# Patient Record
Sex: Female | Born: 1980 | Race: White | Hispanic: No | Marital: Married | Smoking: Never smoker
Health system: Southern US, Community
[De-identification: ages and names within clinical notes are randomized; demographics above are authoritative.]

## PROBLEM LIST (undated history)

## (undated) DIAGNOSIS — M5126 Other intervertebral disc displacement, lumbar region: Secondary | ICD-10-CM

## (undated) DIAGNOSIS — M51369 Other intervertebral disc degeneration, lumbar region without mention of lumbar back pain or lower extremity pain: Secondary | ICD-10-CM

## (undated) DIAGNOSIS — M5136 Other intervertebral disc degeneration, lumbar region: Secondary | ICD-10-CM

## (undated) DIAGNOSIS — F419 Anxiety disorder, unspecified: Secondary | ICD-10-CM

## (undated) HISTORY — DX: Anxiety disorder, unspecified: F41.9

## (undated) HISTORY — DX: Other intervertebral disc displacement, lumbar region: M51.26

## (undated) HISTORY — DX: Other intervertebral disc degeneration, lumbar region without mention of lumbar back pain or lower extremity pain: M51.369

## (undated) HISTORY — DX: Other intervertebral disc degeneration, lumbar region: M51.36

---

## 2007-06-06 ENCOUNTER — Ambulatory Visit: Payer: Self-pay | Admitting: Emergency Medicine

## 2007-08-14 ENCOUNTER — Ambulatory Visit: Payer: Self-pay | Admitting: Emergency Medicine

## 2007-09-27 ENCOUNTER — Ambulatory Visit: Payer: Self-pay | Admitting: Otolaryngology

## 2008-05-02 ENCOUNTER — Emergency Department: Payer: Self-pay | Admitting: Emergency Medicine

## 2008-07-05 ENCOUNTER — Ambulatory Visit: Payer: Self-pay | Admitting: Internal Medicine

## 2009-06-28 ENCOUNTER — Ambulatory Visit: Payer: Self-pay | Admitting: Internal Medicine

## 2009-11-07 ENCOUNTER — Observation Stay: Payer: Self-pay | Admitting: Obstetrics & Gynecology

## 2009-11-09 ENCOUNTER — Inpatient Hospital Stay: Payer: Self-pay

## 2010-09-29 ENCOUNTER — Ambulatory Visit: Payer: Self-pay | Admitting: Internal Medicine

## 2011-01-03 ENCOUNTER — Ambulatory Visit: Payer: Self-pay | Admitting: Family Medicine

## 2013-05-16 ENCOUNTER — Ambulatory Visit: Payer: Self-pay | Admitting: Family Medicine

## 2013-11-29 ENCOUNTER — Emergency Department: Payer: Self-pay | Admitting: Emergency Medicine

## 2013-11-29 LAB — URINALYSIS, COMPLETE
BILIRUBIN, UR: NEGATIVE
BLOOD: NEGATIVE
Bacteria: NONE SEEN
Glucose,UR: NEGATIVE mg/dL (ref 0–75)
Ketone: NEGATIVE
Leukocyte Esterase: NEGATIVE
NITRITE: NEGATIVE
Ph: 9 (ref 4.5–8.0)
RBC,UR: 1 /HPF (ref 0–5)
SPECIFIC GRAVITY: 1.02 (ref 1.003–1.030)
WBC UR: 2 /HPF (ref 0–5)

## 2013-11-29 LAB — COMPREHENSIVE METABOLIC PANEL
ALK PHOS: 147 U/L — AB
Albumin: 4.1 g/dL (ref 3.4–5.0)
Anion Gap: 7 (ref 7–16)
BUN: 11 mg/dL (ref 7–18)
Bilirubin,Total: 1 mg/dL (ref 0.2–1.0)
CALCIUM: 8.9 mg/dL (ref 8.5–10.1)
CO2: 25 mmol/L (ref 21–32)
CREATININE: 0.83 mg/dL (ref 0.60–1.30)
Chloride: 106 mmol/L (ref 98–107)
EGFR (African American): >60
EGFR (Non-African Amer.): >60
Glucose: 134 mg/dL — ABNORMAL HIGH (ref 65–99)
Osmolality: 277 (ref 275–301)
Potassium: 3.9 mmol/L (ref 3.5–5.1)
SGOT(AST): 56 U/L — ABNORMAL HIGH (ref 15–37)
SGPT (ALT): 99 U/L — ABNORMAL HIGH (ref 12–78)
Sodium: 138 mmol/L (ref 136–145)
Total Protein: 8.1 g/dL (ref 6.4–8.2)

## 2013-11-29 LAB — CBC WITH DIFFERENTIAL/PLATELET
Basophil #: 0 10*3/uL (ref 0.0–0.1)
Basophil %: 0.2 %
EOS PCT: 0.2 %
Eosinophil #: 0 10*3/uL (ref 0.0–0.7)
HCT: 43.7 % (ref 35.0–47.0)
HGB: 14.7 g/dL (ref 12.0–16.0)
LYMPHS ABS: 0.2 10*3/uL — AB (ref 1.0–3.6)
Lymphocyte %: 1.8 %
MCH: 28.2 pg (ref 26.0–34.0)
MCHC: 33.7 g/dL (ref 32.0–36.0)
MCV: 84 fL (ref 80–100)
Monocyte #: 0.5 x10 3/mm (ref 0.2–0.9)
Monocyte %: 4.1 %
NEUTROS PCT: 93.7 %
Neutrophil #: 10.6 10*3/uL — ABNORMAL HIGH (ref 1.4–6.5)
Platelet: 286 10*3/uL (ref 150–440)
RBC: 5.23 10*6/uL — AB (ref 3.80–5.20)
RDW: 13.5 % (ref 11.5–14.5)
WBC: 11.3 10*3/uL — ABNORMAL HIGH (ref 3.6–11.0)

## 2013-11-29 LAB — LIPASE, BLOOD: LIPASE: 60 U/L — AB (ref 73–393)

## 2014-04-18 ENCOUNTER — Ambulatory Visit: Payer: Self-pay

## 2014-04-18 LAB — RAPID STREP-A WITH REFLX: Micro Text Report: NEGATIVE

## 2014-04-21 LAB — BETA STREP CULTURE(ARMC)

## 2015-01-30 ENCOUNTER — Ambulatory Visit: Payer: Self-pay | Admitting: Family Medicine

## 2015-03-18 LAB — OB RESULTS CONSOLE HIV ANTIBODY (ROUTINE TESTING): HIV: NONREACTIVE

## 2015-03-18 LAB — OB RESULTS CONSOLE RPR: RPR: NONREACTIVE

## 2015-05-14 LAB — OB RESULTS CONSOLE GBS: GBS: NEGATIVE

## 2015-05-14 LAB — OB RESULTS CONSOLE GC/CHLAMYDIA
Chlamydia: NEGATIVE
GC PROBE AMP, GENITAL: NEGATIVE

## 2015-05-30 ENCOUNTER — Encounter
Admission: EM | Disposition: A | Discharge status: Home / Self Care | Payer: Self-pay | Source: Home / Self Care | Attending: Obstetrics and Gynecology

## 2015-05-30 ENCOUNTER — Inpatient Hospital Stay: Payer: 59 | Admitting: Anesthesiology

## 2015-05-30 ENCOUNTER — Inpatient Hospital Stay
Admission: EM | Admit: 2015-05-30 | Discharge: 2015-06-01 | DRG: 765 | Disposition: A | Discharge status: Home / Self Care | Payer: 59 | Attending: Obstetrics and Gynecology | Admitting: Obstetrics and Gynecology

## 2015-05-30 ENCOUNTER — Encounter: Payer: Self-pay | Admitting: Anesthesiology

## 2015-05-30 DIAGNOSIS — D62 Acute posthemorrhagic anemia: Secondary | ICD-10-CM | POA: Diagnosis not present

## 2015-05-30 DIAGNOSIS — Z880 Allergy status to penicillin: Secondary | ICD-10-CM | POA: Diagnosis not present

## 2015-05-30 DIAGNOSIS — Z3A38 38 weeks gestation of pregnancy: Secondary | ICD-10-CM | POA: Diagnosis present

## 2015-05-30 DIAGNOSIS — K660 Peritoneal adhesions (postprocedural) (postinfection): Secondary | ICD-10-CM | POA: Diagnosis present

## 2015-05-30 DIAGNOSIS — O3421 Maternal care for scar from previous cesarean delivery: Secondary | ICD-10-CM | POA: Diagnosis present

## 2015-05-30 LAB — CBC
HCT: 35.9 % (ref 35.0–47.0)
Hemoglobin: 11.7 g/dL — ABNORMAL LOW (ref 12.0–16.0)
MCH: 26.7 pg (ref 26.0–34.0)
MCHC: 32.6 g/dL (ref 32.0–36.0)
MCV: 81.7 fL (ref 80.0–100.0)
PLATELETS: 153 10*3/uL (ref 150–440)
RBC: 4.39 MIL/uL (ref 3.80–5.20)
RDW: 15.4 % — ABNORMAL HIGH (ref 11.5–14.5)
WBC: 9.5 10*3/uL (ref 3.6–11.0)

## 2015-05-30 LAB — TYPE AND SCREEN
ABO/RH(D): O POS
Antibody Screen: NEGATIVE

## 2015-05-30 LAB — ABO/RH: ABO/RH(D): O POS

## 2015-05-30 SURGERY — Surgical Case
Anesthesia: Spinal | Wound class: Clean Contaminated

## 2015-05-30 MED ORDER — BUPIVACAINE IN DEXTROSE 0.75-8.25 % IT SOLN
INTRATHECAL | Status: DC | PRN
  Administered 2015-05-30: 1.6 mL via INTRATHECAL

## 2015-05-30 MED ORDER — SIMETHICONE 80 MG PO CHEW: 80.0000 mg | CHEWABLE_TABLET | ORAL | Status: DC | PRN

## 2015-05-30 MED ORDER — PROMETHAZINE HCL 25 MG/ML IJ SOLN
12.5000 mg | Freq: Four times a day (QID) | INTRAMUSCULAR | Status: DC | PRN
  Administered 2015-05-30: 12.5 mg via INTRAMUSCULAR
  Filled 2015-05-30: qty 1

## 2015-05-30 MED ORDER — NALBUPHINE HCL 10 MG/ML IJ SOLN
5.0000 mg | Freq: Once | INTRAMUSCULAR | Status: AC | PRN
  Filled 2015-05-30: qty 0.5

## 2015-05-30 MED ORDER — LACTATED RINGERS IV SOLN: 500.0000 mL | INTRAVENOUS | Status: DC | PRN

## 2015-05-30 MED ORDER — SCOPOLAMINE 1 MG/3DAYS TD PT72
1.0000 | MEDICATED_PATCH | Freq: Once | TRANSDERMAL | Status: DC
  Filled 2015-05-30: qty 1

## 2015-05-30 MED ORDER — LACTATED RINGERS IV SOLN
INTRAVENOUS | Status: DC
  Administered 2015-05-30 (×2): via INTRAVENOUS

## 2015-05-30 MED ORDER — BUPIVACAINE HCL (PF) 0.5 % IJ SOLN
INTRAMUSCULAR | Status: AC
  Filled 2015-05-30: qty 30

## 2015-05-30 MED ORDER — NALOXONE HCL 1 MG/ML IJ SOLN: 1.0000 ug/kg/h | INTRAVENOUS | Status: DC | PRN

## 2015-05-30 MED ORDER — CITRIC ACID-SODIUM CITRATE 334-500 MG/5ML PO SOLN
ORAL | Status: AC
  Administered 2015-05-30: 30 mL
  Filled 2015-05-30: qty 15

## 2015-05-30 MED ORDER — FENTANYL CITRATE (PF) 100 MCG/2ML IJ SOLN: 25.0000 ug | INTRAMUSCULAR | Status: DC | PRN

## 2015-05-30 MED ORDER — OXYCODONE-ACETAMINOPHEN 5-325 MG PO TABS: 1.0000 | ORAL_TABLET | ORAL | Status: DC | PRN

## 2015-05-30 MED ORDER — ONDANSETRON HCL 4 MG/2ML IJ SOLN
4.0000 mg | Freq: Three times a day (TID) | INTRAMUSCULAR | Status: DC | PRN
  Administered 2015-05-30: 4 mg via INTRAVENOUS
  Filled 2015-05-30: qty 2

## 2015-05-30 MED ORDER — KETOROLAC TROMETHAMINE 30 MG/ML IJ SOLN: 30.0000 mg | Freq: Four times a day (QID) | INTRAMUSCULAR | Status: AC | PRN

## 2015-05-30 MED ORDER — DIPHENHYDRAMINE HCL 50 MG/ML IJ SOLN: 12.5000 mg | INTRAMUSCULAR | Status: DC | PRN

## 2015-05-30 MED ORDER — EPHEDRINE SULFATE 50 MG/ML IJ SOLN
INTRAMUSCULAR | Status: DC | PRN
  Administered 2015-05-30: 15 mg via INTRAVENOUS

## 2015-05-30 MED ORDER — LACTATED RINGERS IV SOLN: INTRAVENOUS | Status: DC

## 2015-05-30 MED ORDER — CLINDAMYCIN PHOSPHATE 900 MG/50ML IV SOLN: 900.0000 mg | INTRAVENOUS | Status: DC

## 2015-05-30 MED ORDER — NALOXONE HCL 0.4 MG/ML IJ SOLN: 0.4000 mg | INTRAMUSCULAR | Status: DC | PRN

## 2015-05-30 MED ORDER — PHENYLEPHRINE HCL 10 MG/ML IJ SOLN
INTRAMUSCULAR | Status: DC | PRN
  Administered 2015-05-30: 200 ug via INTRAVENOUS

## 2015-05-30 MED ORDER — SODIUM CHLORIDE 0.9 % IJ SOLN: 3.0000 mL | INTRAMUSCULAR | Status: DC | PRN

## 2015-05-30 MED ORDER — OXYTOCIN BOLUS FROM INFUSION: 500.0000 mL | INTRAVENOUS | Status: DC

## 2015-05-30 MED ORDER — MORPHINE SULFATE (PF) 0.5 MG/ML IJ SOLN: INTRAMUSCULAR | Status: DC | PRN

## 2015-05-30 MED ORDER — SENNOSIDES-DOCUSATE SODIUM 8.6-50 MG PO TABS
2.0000 | ORAL_TABLET | ORAL | Status: DC
  Administered 2015-05-30: 2 via ORAL
  Filled 2015-05-30: qty 2

## 2015-05-30 MED ORDER — TERBUTALINE SULFATE 1 MG/ML IJ SOLN
INTRAMUSCULAR | Status: AC
Start: 2015-05-30 — End: 2015-05-31
  Filled 2015-05-30: qty 1

## 2015-05-30 MED ORDER — WITCH HAZEL-GLYCERIN EX PADS: 1.0000 "application " | MEDICATED_PAD | CUTANEOUS | Status: DC | PRN

## 2015-05-30 MED ORDER — OXYTOCIN 40 UNITS IN LACTATED RINGERS INFUSION - SIMPLE MED
INTRAVENOUS | Status: AC
  Filled 2015-05-30: qty 1000

## 2015-05-30 MED ORDER — BUPIVACAINE HCL (PF) 0.5 % IJ SOLN: 10.0000 mL | Freq: Once | INTRAMUSCULAR | Status: DC

## 2015-05-30 MED ORDER — BUPIVACAINE 0.25 % ON-Q PUMP DUAL CATH 400 ML
INJECTION | Status: AC
  Filled 2015-05-30: qty 400

## 2015-05-30 MED ORDER — BUPIVACAINE 0.25 % ON-Q PUMP DUAL CATH 400 ML: 400.0000 mL | INJECTION | Status: DC

## 2015-05-30 MED ORDER — OXYTOCIN 40 UNITS IN LACTATED RINGERS INFUSION - SIMPLE MED
62.5000 mL/h | INTRAVENOUS | Status: AC
  Administered 2015-05-31: 62.5 mL/h via INTRAVENOUS
  Filled 2015-05-30: qty 1000

## 2015-05-30 MED ORDER — IBUPROFEN 600 MG PO TABS
600.0000 mg | ORAL_TABLET | Freq: Four times a day (QID) | ORAL | Status: DC
  Administered 2015-05-30 – 2015-06-01 (×7): 600 mg via ORAL
  Filled 2015-05-30 (×7): qty 1

## 2015-05-30 MED ORDER — BUPIVACAINE HCL (PF) 0.5 % IJ SOLN
INTRAMUSCULAR | Status: DC | PRN
  Administered 2015-05-30: 10 mL

## 2015-05-30 MED ORDER — MEPERIDINE HCL 25 MG/ML IJ SOLN: 6.2500 mg | INTRAMUSCULAR | Status: DC | PRN

## 2015-05-30 MED ORDER — DIPHENHYDRAMINE HCL 25 MG PO CAPS: 25.0000 mg | ORAL_CAPSULE | ORAL | Status: DC | PRN

## 2015-05-30 MED ORDER — ONDANSETRON HCL 4 MG/2ML IJ SOLN: 4.0000 mg | Freq: Once | INTRAMUSCULAR | Status: AC | PRN

## 2015-05-30 MED ORDER — PRENATAL MULTIVITAMIN CH
1.0000 | ORAL_TABLET | Freq: Every day | ORAL | Status: DC
  Administered 2015-05-31 – 2015-06-01 (×2): 1 via ORAL
  Filled 2015-05-30 (×2): qty 1

## 2015-05-30 MED ORDER — MENTHOL 3 MG MT LOZG
1.0000 | LOZENGE | OROMUCOSAL | Status: DC | PRN
  Filled 2015-05-30: qty 9

## 2015-05-30 MED ORDER — SIMETHICONE 80 MG PO CHEW
80.0000 mg | CHEWABLE_TABLET | Freq: Three times a day (TID) | ORAL | Status: DC
  Administered 2015-05-31 – 2015-06-01 (×5): 80 mg via ORAL
  Filled 2015-05-30 (×5): qty 1

## 2015-05-30 MED ORDER — DIPHENHYDRAMINE HCL 25 MG PO CAPS: 25.0000 mg | ORAL_CAPSULE | Freq: Four times a day (QID) | ORAL | Status: DC | PRN

## 2015-05-30 MED ORDER — LANOLIN HYDROUS EX OINT: 1.0000 "application " | TOPICAL_OINTMENT | CUTANEOUS | Status: DC | PRN

## 2015-05-30 MED ORDER — OXYTOCIN 40 UNITS IN LACTATED RINGERS INFUSION - SIMPLE MED
62.5000 mL/h | INTRAVENOUS | Status: DC
  Administered 2015-05-30: 1000 mL via INTRAVENOUS

## 2015-05-30 MED ORDER — SIMETHICONE 80 MG PO CHEW
80.0000 mg | CHEWABLE_TABLET | ORAL | Status: DC
  Administered 2015-05-30 – 2015-05-31 (×2): 80 mg via ORAL
  Filled 2015-05-30 (×2): qty 1

## 2015-05-30 MED ORDER — DIBUCAINE 1 % RE OINT: 1.0000 "application " | TOPICAL_OINTMENT | RECTAL | Status: DC | PRN

## 2015-05-30 MED ORDER — MORPHINE SULFATE (PF) 0.5 MG/ML IJ SOLN
INTRAMUSCULAR | Status: DC | PRN
  Administered 2015-05-30: .2 mg via EPIDURAL

## 2015-05-30 MED ORDER — GENTAMICIN SULFATE 40 MG/ML IJ SOLN
5.0000 mg/kg | INTRAVENOUS | Status: AC
  Administered 2015-05-30: 500 mg via INTRAVENOUS
  Filled 2015-05-30: qty 12.5

## 2015-05-30 MED ORDER — NALBUPHINE HCL 10 MG/ML IJ SOLN
5.0000 mg | INTRAMUSCULAR | Status: DC | PRN
  Filled 2015-05-30: qty 0.5

## 2015-05-30 MED ORDER — OXYCODONE-ACETAMINOPHEN 5-325 MG PO TABS
2.0000 | ORAL_TABLET | ORAL | Status: DC | PRN
Start: 2015-05-30 — End: 2015-05-31

## 2015-05-30 MED ORDER — FENTANYL CITRATE (PF) 100 MCG/2ML IJ SOLN
INTRAMUSCULAR | Status: DC | PRN
  Administered 2015-05-30 (×5): 50 ug via INTRAVENOUS

## 2015-05-30 MED ORDER — ONDANSETRON HCL 4 MG/2ML IJ SOLN
4.0000 mg | Freq: Four times a day (QID) | INTRAMUSCULAR | Status: DC | PRN
  Administered 2015-05-30: 4 mg via INTRAVENOUS

## 2015-05-30 SURGICAL SUPPLY — 28 items
BAG COUNTER SPONGE EZ (MISCELLANEOUS) ×2 IMPLANT
CANISTER SUCT 3000ML (MISCELLANEOUS) ×3 IMPLANT
CATH KIT ON-Q SILVERSOAK 5IN (CATHETERS) ×6 IMPLANT
CHLORAPREP W/TINT 26ML (MISCELLANEOUS) ×6 IMPLANT
CLOSURE WOUND 1/2 X4 (GAUZE/BANDAGES/DRESSINGS) ×1
COUNTER SPONGE BAG EZ (MISCELLANEOUS) ×1
DRSG TELFA 3X8 NADH (GAUZE/BANDAGES/DRESSINGS) ×3 IMPLANT
ELECT CAUTERY BLADE 6.4 (BLADE) ×3 IMPLANT
GAUZE SPONGE 4X4 12PLY STRL (GAUZE/BANDAGES/DRESSINGS) ×3 IMPLANT
GLOVE BIO SURGEON STRL SZ7 (GLOVE) ×3 IMPLANT
GLOVE INDICATOR 7.5 STRL GRN (GLOVE) ×3 IMPLANT
GOWN STRL REUS W/ TWL LRG LVL3 (GOWN DISPOSABLE) ×3 IMPLANT
GOWN STRL REUS W/TWL LRG LVL3 (GOWN DISPOSABLE) ×6
LIQUID BAND (GAUZE/BANDAGES/DRESSINGS) ×3 IMPLANT
NS IRRIG 1000ML POUR BTL (IV SOLUTION) ×3 IMPLANT
PACK C SECTION AR (MISCELLANEOUS) ×3 IMPLANT
PAD GROUND ADULT SPLIT (MISCELLANEOUS) ×3 IMPLANT
PAD OB MATERNITY 4.3X12.25 (PERSONAL CARE ITEMS) ×3 IMPLANT
PAD PREP 24X41 OB/GYN DISP (PERSONAL CARE ITEMS) ×3 IMPLANT
STRIP CLOSURE SKIN 1/2X4 (GAUZE/BANDAGES/DRESSINGS) ×2 IMPLANT
SUT MNCRL AB 4-0 PS2 18 (SUTURE) ×3 IMPLANT
SUT PDS AB 1 TP1 96 (SUTURE) ×6 IMPLANT
SUT PLAIN 2 0 XLH (SUTURE) ×3 IMPLANT
SUT PLAIN GUT 2-0 30 C14 SG823 (SUTURE) ×3
SUT VIC AB 0 CTX 36 (SUTURE) ×4
SUT VIC AB 0 CTX36XBRD ANBCTRL (SUTURE) ×2 IMPLANT
SUT VIC AB 2-0 CT1 36 (SUTURE) ×3 IMPLANT
SUTURE PLN GUT2-0 30 C14 SG823 (SUTURE) ×1 IMPLANT

## 2015-05-30 NOTE — Discharge Summary (Signed)
Obstetric Discharge Summary Reason for Admission: onset of labor and cesarean section Prenatal Procedures: NST Intrapartum Procedures: primary low transverse c-section Postpartum Procedures: none Complications-Operative and Postpartum: none, mild blood loss anemia HEMOGLOBIN  Date Value Ref Range Status  05/30/2015 11.7* 12.0 - 16.0 g/dL Final   HGB  Date Value Ref Range Status  11/29/2013 14.7 12.0-16.0 g/dL Final   HCT  Date Value Ref Range Status  05/30/2015 35.9 35.0 - 47.0 % Final  11/29/2013 43.7 35.0-47.0 % Final   Hct: 35.9 pre-op, to 29 post-op Physical Exam:  General: alert Lochia: appropriate Uterine Fundus: firm Incision: healing well, no significant drainage DVT Evaluation: No evidence of DVT seen on physical exam.  Discharge Diagnoses: Term Pregnancy-delivered  Discharge Information: Date: 05/30/2015 Activity: pelvic rest Diet: routine Medications: PNV, Ibuprofen and Vicodin Condition: stable Instructions:  Discharge instructions:   Call office if you have any of the following: headache, visual changes, fever >100 F, chills, breast concerns, excessive vaginal bleeding, incision drainage or problems, leg pain or redness, depression or any other concerns.   Activity: Do not lift > 10 lbs for 6 weeks.  No intercourse or tampons for 6 weeks.  No driving for 1-2 weeks.    Discharge to: home   Newborn Data: Live born female  Birth Weight: 8 lb 15.2 oz (4060 g) APGAR: 9, 9  Home with mother.   Vella Kohleramara K Brothers, CNM

## 2015-05-30 NOTE — Transfer of Care (Signed)
Immediate Anesthesia Transfer of Care Note  Patient: Susan Stevenson  Procedure(s) Performed: Procedure(s): CESAREAN SECTION  (N/A)  Patient Location: PACU  Anesthesia Type:Spinal  Level of Consciousness: awake, alert  and oriented  Airway & Oxygen Therapy: Patient Spontanous Breathing  Post-op Assessment: Report given to RN and Post -op Vital signs reviewed and stable  Post vital signs: Reviewed and stable  Last Vitals:  Filed Vitals:   05/30/15 1733  BP: 115/68  Pulse: 109  Temp: 36.7 C  Resp: 12    Complications: No apparent anesthesia complications

## 2015-05-30 NOTE — Op Note (Signed)
Preoperative Diagnosis: 1) 34 y.o. G1P0 at 3784w2d 2) History of prior C-section 3) Desires repeat C-section 4) Desires permanent surgical sterilization  Postoperative Diagnosis: 1) 34 y.o. G1P0 at 5684w2d 2) History of prior C-section 3) Desires repeat C-section 4) Dense adhesive disease  Operation Performed: Repeat low transverse C-section via pfannenstiel skin incision  Indiciation: Prior C-section  Anesthesia: Spinal  Primary Surgeon: Vena AustriaAndreas Taylinn Brabant, MD   Assistant:** Milinda Hirschfeldourtney Subhuddi, CNM  Preoperative Antibiotics: Gentamycin & Clindamycin  Estimated Blood Loss: \85500mL  IV Fluids: 1100mL  Urine Output:: 250mL  Drains or Tubes: Foley to gravity drainage, ON-Q catheter system  Implants: none  Specimens Removed: none  Complications: none  Intraoperative Findings:  Normal tubes ovaries and uterus.  Delivery resulted in the birth of a liveborn female infant, APGAR (1 MIN): 9   APGAR (5 MINS):  9 APGAR (10 MINS):  , weight 4060g, 8lbs 15oz  Patient Condition:stable  Procedure in Detail:  Patient was taken to the operating room were she was administered regional anesthesia.  She was positioned in the supine position, prepped and draped in the  Usual sterile fashion.  Prior to proceeding with the case a time out was performed and the level of anesthetic was checked and noted to be adequate.  Utilizing the scalpel a pfannenstiel skin incision was made 2cm above the pubic symphysis utilizing the patient's pre-existing scar and carried down sharply to the the level of the rectus fascia.  The fascia was incised in the midline using the scalpel and then extended using mayo scissors.  The superior border of the rectus fascia was grasped with two Kocher clamps and the underlying rectus muscles were dissected of the fascia using blunt dissection.  The median raphae was incised using Mayo scissors.   The inferior border of the rectus fascia was dissected of the rectus muscles in a  similar fashion.  The midline was identified, the peritoneum was entered bluntly and expanded using manual tractions.  The uterus was noted to be in a none rotated position.  Next the bladder blade was placed retracting the bladder caudally.  A bladder flap was created. The bladder reflection was grasped with a pickup, and Metzenbaum scissors were then used the undermine the bladder reflection.  The bladder flap was developed using digital dissection.  The bladder blade was replaced retracting the bladder caudally out of the operative field.  A low transverse incision was scored on the lower uterine segment.  The hysterotomy was entered bluntly using the operators finger.  The hysterotomy incision was extended using manual traction.  The operators hand was placed within the hysterotomy position noting the fetus to be within the OA position.  The vertex was grasped, flexed, brought to the incision, and delivered a traumatically using fundal pressure.  The remainder of the body delivered with ease.  The infant was suctioned, cord was clamped and cut before handing off to the awaiting neonatologist.  The placenta was delivered using manual extraction.  The uterus was unable to be exteriorized secondary to adhesions to the anterior abdominal wall.  The uterus was left in situ and wiped clean of clots and debris using two moist laps.  The hysterotomy was closed using a two layer closure of 0 Vicryl, with the first being a running locked, the second a vertical imbricating.  Figure of eight suture was used on the right aspect of the incision for some oozing.  Bladder flap was over sewn using a 2-0 Vicryl in a running fashion.  The peritoneal gutters were wiped clean of clots and debris using two moist laps.  The hysterotomy incision was re-inspected noted to be hemostatic.  The rectus muscles were re-approximated in the midline using a single 2-0 Vicryl mattress stitch.  The rectus muscles were inspected noted to be  hemostatic.  The superior border of the rectus fascia was grasped with a Kocher clamp.  The ON-Q trocars were then placed 4cm above the superior border of the incision and tunneled subfascially.  The introducers were removed and the catheters were threaded through the sleeves after which the sleeves were removed.  The fascia was closed using a looped #1 PDS in a running fashion taking 1cm by 1cm bites.  The subcutaneous tissue was irrigated using warm saline, hemostasis achieved using the bovis.  The subcutaneous dead space was greater 3cm and was closed. The subcutaneous dead space was obliterated by using a 53-T 0 Chromic in a running fashion.     The skin was closed using staples.  Sponge needle and instrument counts were corrects times two.  The patient tolerated the procedure well and was taken to the recovery room in stable condition.

## 2015-05-30 NOTE — OB Triage Note (Signed)
Pt presents to l/d with c/o contractions since this am. Denies any vaginal bleeding or ROM. Sve=pt 's cervix is 3cm dilated with bulging bag. Pt is scheduled for repeat c section in 2 weeks. Dr Bonney Aidstaebler notified of pt's status. No orders received at that time.

## 2015-05-30 NOTE — Anesthesia Preprocedure Evaluation (Signed)
Anesthesia Evaluation  Patient identified by MRN, date of birth, ID band Patient awake    Reviewed: Allergy & Precautions, NPO status , Patient's Chart, lab work & pertinent test results, reviewed documented beta blocker date and time   Airway Mallampati: II  TM Distance: >3 FB     Dental  (+) Chipped   Pulmonary          Cardiovascular     Neuro/Psych    GI/Hepatic   Endo/Other    Renal/GU      Musculoskeletal   Abdominal   Peds  Hematology   Anesthesia Other Findings   Reproductive/Obstetrics                             Anesthesia Physical Anesthesia Plan  ASA: II  Anesthesia Plan: Spinal   Post-op Pain Management:    Induction:   Airway Management Planned: Nasal Cannula  Additional Equipment:   Intra-op Plan:   Post-operative Plan:   Informed Consent: I have reviewed the patients History and Physical, chart, labs and discussed the procedure including the risks, benefits and alternatives for the proposed anesthesia with the patient or authorized representative who has indicated his/her understanding and acceptance.     Plan Discussed with: CRNA  Anesthesia Plan Comments:         Anesthesia Quick Evaluation

## 2015-05-30 NOTE — Plan of Care (Signed)
FHR in the OR prior to incision 135

## 2015-05-30 NOTE — H&P (Signed)
Obstetric H&P   Chief Complaint: Contractons  Prenatal Care Provider: [facility]  History of Present Illness: 34 y.o. [id number] at 8272w2d with history of prior C-section presenting with contractions.  No LOF, no VB, +FM  O pos / RI / VZI  Review of Systems: 10 point review of systems negative unless otherwise noted in HPI  Past Medical History: History reviewed. No pertinent past medical history.  Past Surgical History: History reviewed. No pertinent past surgical history.  Past Obstetric History:  Past Gynecologic History:  Family History: History reviewed. No pertinent family history.  Social History: History   Social History  . Marital Status: Married    Spouse Name: N/A  . Number of Children: N/A  . Years of Education: N/A   Occupational History  . Not on file.   Social History Main Topics  . Smoking status: Not on file  . Smokeless tobacco: Not on file  . Alcohol Use: Not on file  . Drug Use: Not on file  . Sexual Activity: Not on file   Other Topics Concern  . Not on file   Social History Narrative  . No narrative on file    Medications: Prior to Admission medications   Not on File    Allergies: Allergies  Allergen Reactions  . Penicillins Anaphylaxis    Physical Exam: Vitals: Weight 100.699 kg (222 lb).  Urine Dip Protein: N/A  FHT: 120, moderate, positive accels, no decels Toco: q833min  General: painfully contracting HEENT: normocephalic, anicteric Pulmonary: no increased work of breathing Cardiovascular: RRR Abdomen: Gravid,  Non-tender Leopolds: vtx Genitourinary: 3cm to 5cm in the course of 1hr Extremities: no edema  Labs: No results found for this or any previous visit (from the past 24 hour(s)).  Assessment: 34 y.o. [id number]  1472w2d term labor history of prior C-section desiring repeat with BTL  Plan: 1) Labor - proceed with C-section  2) Fetus - cat I tracing  3) Disposition - pending delivery

## 2015-05-30 NOTE — Anesthesia Procedure Notes (Signed)
Spinal Patient location during procedure: OR Staffing Anesthesiologist: Berdine AddisonHOMAS, Mylah Baynes Performed by: anesthesiologist  Preanesthetic Checklist Completed: patient identified, site marked, surgical consent, pre-op evaluation, timeout performed, IV checked and risks and benefits discussed Spinal Block Patient position: sitting Prep: Betadine Patient monitoring: heart rate, cardiac monitor, continuous pulse ox and blood pressure Approach: midline Location: L3-4 Injection technique: single-shot Needle Needle type: Pencil-Tip  Needle gauge: 25 G Needle length: 9 cm Assessment Sensory level: T8

## 2015-05-31 LAB — CBC
HCT: 29.3 % — ABNORMAL LOW (ref 35.0–47.0)
HEMOGLOBIN: 9.6 g/dL — AB (ref 12.0–16.0)
MCH: 26.5 pg (ref 26.0–34.0)
MCHC: 32.8 g/dL (ref 32.0–36.0)
MCV: 80.8 fL (ref 80.0–100.0)
Platelets: 152 10*3/uL (ref 150–440)
RBC: 3.63 MIL/uL — ABNORMAL LOW (ref 3.80–5.20)
RDW: 15.4 % — AB (ref 11.5–14.5)
WBC: 10.7 10*3/uL (ref 3.6–11.0)

## 2015-05-31 LAB — RPR: RPR Ser Ql: NONREACTIVE

## 2015-05-31 MED ORDER — HYDROCODONE-ACETAMINOPHEN 5-325 MG PO TABS: 1.0000 | ORAL_TABLET | Freq: Four times a day (QID) | ORAL | Status: DC | PRN

## 2015-05-31 NOTE — Anesthesia Post-op Follow-up Note (Signed)
  Anesthesia Pain Follow-up Note  Patient: Susan Stevenson  Day #: 1  Date of Follow-up: 05/31/2015 Time: 9:03 AM  Last Vitals:  Filed Vitals:   05/31/15 0720  BP: 100/53  Pulse: 80  Temp: 36.8 C  Resp: 18    Level of Consciousness: alert  Pain: none   Side Effects:None  Catheter Site Exam:clean, dry  Plan: D/C from anesthesia care  Steaven Wholey

## 2015-05-31 NOTE — Progress Notes (Signed)
  Post-operative Day 1  Subjective: Tolerating po  Objective: Blood pressure 114/62, pulse 102, temperature 98.5 F (36.9 C), temperature source Oral, resp. rate 18, height 5\' 6"  (1.676 m), weight 222 lb (100.699 kg), last menstrual period 09/04/2014, SpO2 99 %, unknown if currently breastfeeding.  Physical Exam:  General: alert Lochia: appropriate Uterine Fundus: firm Incision: occlusive dressing, on-Q intact DVT Evaluation: No evidence of DVT seen on physical exam. Abdomen: soft, NT   Recent Labs  05/30/15 1543 05/31/15 0518  HGB 11.7* 9.6*  HCT 35.9 29.3*    Assessment POD#1, mild blood loss anemia  Plan: Continue PO care, Advance activity as tolerated and Fe replacement, anemia precautions  Feeding: breast & bottle Contraception: BTL RI /VI  TDAP up to date   Susan Stevenson, Susan Stevenson, [place] 05/31/2015, 2:31 PM

## 2015-05-31 NOTE — Anesthesia Postprocedure Evaluation (Signed)
  Anesthesia Post-op Note  Patient: Susan Stevenson  Procedure(s) Performed: Procedure(s): CESAREAN SECTION  (N/A)  Anesthesia type:Spinal  Patient location: PACU  Post pain: Pain level controlled  Post assessment: Post-op Vital signs reviewed, Patient's Cardiovascular Status Stable, Respiratory Function Stable, Patent Airway and No signs of Nausea or vomiting  Post vital signs: Reviewed and stable  Last Vitals:  Filed Vitals:   05/31/15 0720  BP: 100/53  Pulse: 80  Temp: 36.8 C  Resp: 18    Level of consciousness: awake, alert  and patient cooperative  Complications: No apparent anesthesia complications

## 2015-06-01 MED ORDER — IBUPROFEN 600 MG PO TABS: 600.0000 mg | ORAL_TABLET | Freq: Four times a day (QID) | ORAL | Status: DC

## 2015-06-01 MED ORDER — FERROUS SULFATE 325 (65 FE) MG PO TABS: 325.0000 mg | ORAL_TABLET | Freq: Every day | ORAL | Status: DC

## 2015-06-01 MED ORDER — HYDROCODONE-ACETAMINOPHEN 5-325 MG PO TABS: 1.0000 | ORAL_TABLET | Freq: Four times a day (QID) | ORAL | Status: DC | PRN

## 2015-06-01 NOTE — Discharge Instructions (Signed)
Discharge instructions:   Call office if you have any of the following: headache, visual changes, fever >100 F, chills, breast concerns, excessive vaginal bleeding, incision drainage or problems, leg pain or redness, depression or any other concerns.   Activity: Do not lift > 10 lbs for 6 weeks.  No intercourse or tampons for 6 weeks.  No driving for 1-2 weeks.   Call your doctor for increased pain or vaginal bleeding, temperature above 100.4, depression, or concerns.  No strenuous activity or heavy lifting for 6 weeks.  No intercourse, tampons, douching, or enemas for 6 weeks.  No tub baths-showers only.  No driving for 2 weeks or while taking pain medications.  Continue prenatal vitamin and iron.  Keep incision clean and dry.  Call your doctor for incision concerns including redness, swelling, bleeding or drainage, or if begins to come apart.  Increase calories and fluids while breastfeeding.

## 2015-06-01 NOTE — Clinical Documentation Improvement (Signed)
Supporting Information: S/P C section Assessment POD#1, mild blood loss anemia Component     Latest Ref Rng 05/30/2015 05/31/2015  Hemoglobin     12.0 - 16.0 g/dL 45.411.7 (L) 9.6 (L)  HCT     35.0 - 47.0 % 35.9 29.3 (L)    After study, please clarify acuity of anemia in progress notes and discharge summary  Acute blood loss anemia Acute on Chronic blood loss anemia Chronic blood loss anemia Other Unable to determine  . Document any associated diagnoses/conditions  Thank You, Lekesha Claw T. Luiz OchoaWilliams RN, MSN, MBA/MHA Clinical Documentation Specialist [email or web address] Office # [phone]

## 2015-06-01 NOTE — Progress Notes (Signed)
Discharge instructions provided.  Pt and sig other verbalize understanding of all instructions and follow-up care.  Prescriptions given.  Pt discharged to home with infant at 1700 on 06/01/15 via wheelchair by volunteer. Reynold BowenSusan Paisley Quashon Jesus, RN 06/01/2015 9:43 PM

## 2015-06-01 NOTE — Progress Notes (Signed)
Susan Stevenson, CNM states that pt received TDaP vaccine during pregnancy.  Reynold BowenSusan Paisley Garen Woolbright, RN 06/01/2015 1:03 PM

## 2015-06-03 ENCOUNTER — Encounter: Payer: Self-pay | Admitting: Obstetrics and Gynecology

## 2015-06-04 ENCOUNTER — Other Ambulatory Visit: Payer: Self-pay

## 2015-06-05 ENCOUNTER — Encounter: Admission: RE | Payer: Self-pay | Source: Ambulatory Visit

## 2015-06-05 ENCOUNTER — Inpatient Hospital Stay: Admission: RE | Admit: 2015-06-05 | Payer: 59 | Source: Ambulatory Visit | Admitting: Obstetrics and Gynecology

## 2015-06-05 SURGERY — Surgical Case
Anesthesia: Choice

## 2016-07-18 ENCOUNTER — Ambulatory Visit
Admission: EM | Admit: 2016-07-18 | Discharge: 2016-07-18 | Disposition: A | Discharge status: Home / Self Care | Payer: 59 | Attending: Family Medicine | Admitting: Family Medicine

## 2016-07-18 ENCOUNTER — Encounter: Payer: Self-pay | Admitting: Emergency Medicine

## 2016-07-18 DIAGNOSIS — J310 Chronic rhinitis: Secondary | ICD-10-CM

## 2016-07-18 DIAGNOSIS — J329 Chronic sinusitis, unspecified: Secondary | ICD-10-CM | POA: Diagnosis not present

## 2016-07-18 DIAGNOSIS — H6593 Unspecified nonsuppurative otitis media, bilateral: Secondary | ICD-10-CM | POA: Diagnosis not present

## 2016-07-18 MED ORDER — FLUTICASONE PROPIONATE 50 MCG/ACT NA SUSP: 1.0000 | Freq: Two times a day (BID) | NASAL | 0 refills | Status: DC

## 2016-07-18 MED ORDER — SALINE SPRAY 0.65 % NA SOLN: 2.0000 | NASAL | 0 refills | Status: DC

## 2016-07-18 MED ORDER — DOXYCYCLINE HYCLATE 100 MG PO CAPS: 100.0000 mg | ORAL_CAPSULE | Freq: Two times a day (BID) | ORAL | 0 refills | Status: DC

## 2016-07-18 MED ORDER — LORATADINE 10 MG PO TABS: 10.0000 mg | ORAL_TABLET | Freq: Every day | ORAL | 0 refills | Status: DC

## 2016-07-18 MED ORDER — IBUPROFEN 800 MG PO TABS: 800.0000 mg | ORAL_TABLET | Freq: Three times a day (TID) | ORAL | 0 refills | Status: DC

## 2016-07-18 NOTE — ED Triage Notes (Signed)
Patient c/o left ear pain that started yesterday.  

## 2016-07-18 NOTE — ED Provider Notes (Signed)
CSN: [id number]     Arrival date & time 07/18/16  1021 History   First MD Initiated Contact with Patient 07/18/16 1039     Chief Complaint  Patient presents with  . Otalgia   (Consider location/radiation/quality/duration/timing/severity/associated sxs/prior Treatment) Established patient to practice Single caucasian female here for evaluation bilateral ear pain left greater than right, popping/unable to clear, post nasal drip, sinus congestion; below eyes more swollen earlier in the week.  PMHx seasonal allergies has used claritin or zyrtec in the past none now  2 children with patient in exam room  LMP Jul 2017  Denied [facility] cancer ENT      History reviewed. No pertinent past medical history. Past Surgical History:  Procedure Laterality Date  . CESAREAN SECTION N/A 05/30/2015   Procedure: CESAREAN SECTION ;  Surgeon: Vena AustriaAndreas Staebler, MD;  Location: [facility] ORS;  Service: Obstetrics;  Laterality: N/A;   History reviewed. No pertinent family history. Social History  Substance Use Topics  . Smoking status: Never Smoker  . Smokeless tobacco: Never Used  . Alcohol use No   OB History    Gravida Para Term Preterm AB Living   3 2 2     1    SAB TAB Ectopic Multiple Live Births         0 1     Review of Systems  Constitutional: Negative for activity change, appetite change, chills, diaphoresis, fatigue, fever and unexpected weight change.  HENT: Positive for congestion, ear pain, facial swelling, hearing loss, postnasal drip, rhinorrhea and sinus pressure. Negative for dental problem, drooling, ear discharge, mouth sores, nosebleeds, sneezing, sore throat, tinnitus, trouble swallowing and voice change.   Eyes: Negative for photophobia, pain, discharge, redness, itching and visual disturbance.  Respiratory: Negative for cough, choking, chest tightness, shortness of breath, wheezing and stridor.   Cardiovascular: Negative for chest pain, palpitations and leg swelling.  Gastrointestinal:  Negative for abdominal distention, abdominal pain, blood in stool, constipation, diarrhea, nausea and vomiting.  Endocrine: Negative for cold intolerance and heat intolerance.  Genitourinary: Negative for difficulty urinating, dysuria and hematuria.  Musculoskeletal: Negative for arthralgias, back pain, gait problem, joint swelling, myalgias, neck pain and neck stiffness.  Skin: Negative for color change, pallor, rash and wound.  Allergic/Immunologic: Positive for environmental allergies. Negative for food allergies.  Neurological: Negative for dizziness, tremors, seizures, syncope, facial asymmetry, speech difficulty, weakness, light-headedness, numbness and headaches.  Hematological: Negative for adenopathy. Does not bruise/bleed easily.  Psychiatric/Behavioral: Negative for agitation, behavioral problems, confusion and sleep disturbance.    Allergies  Penicillins and Percocet [oxycodone-acetaminophen]  Home Medications   Prior to Admission medications   Medication Sig Start Date End Date Taking? Authorizing Provider  norethindrone-ethinyl estradiol (JUNEL FE,GILDESS FE,LOESTRIN FE) 1-20 MG-MCG tablet Take 1 tablet by mouth daily.   Yes Historical Provider, MD  doxycycline (VIBRAMYCIN) 100 MG capsule Take 1 capsule (100 mg total) by mouth 2 (two) times daily. 07/18/16   Barbaraann Barthelina A Telesha Deguzman, NP  fluticasone (FLONASE) 50 MCG/ACT nasal spray Place 1 spray into both nostrils 2 (two) times daily. 07/18/16 08/01/16  Barbaraann Barthelina A Rontae Inglett, NP  ibuprofen (ADVIL,MOTRIN) 800 MG tablet Take 1 tablet (800 mg total) by mouth 3 (three) times daily. 07/18/16   Barbaraann Barthelina A Malayiah Mcbrayer, NP  loratadine (CLARITIN) 10 MG tablet Take 1 tablet (10 mg total) by mouth daily. 07/18/16   Barbaraann Barthelina A Rosann Gorum, NP  sodium chloride (OCEAN) 0.65 % SOLN nasal spray Place 2 sprays into both nostrils every 2 (two) hours while awake. 07/18/16  08/17/16  Barbaraann Barthelina A Ikram Riebe, NP   Meds Ordered and Administered this Visit  Medications - No data to  display  BP 118/76 (BP Location: Left Arm)   Pulse 81   Temp 97.1 F (36.2 C) (Tympanic)   Resp 16   Ht 5\' 5"  (1.651 m)   Wt 200 lb (90.7 kg)   LMP 06/22/2016 (Approximate)   SpO2 99%   Breastfeeding? No   BMI 33.28 kg/m  No data found.   Physical Exam  Constitutional: She is oriented to person, place, and time. Vital signs are normal. She appears well-developed and well-nourished. She is active and cooperative.  Non-toxic appearance. She does not have a sickly appearance. She appears ill. No distress.  HENT:  Head: Normocephalic and atraumatic.  Right Ear: Hearing, external ear and ear canal normal. A middle ear effusion is present.  Left Ear: Hearing, external ear and ear canal normal. A middle ear effusion is present.  Nose: Mucosal edema and rhinorrhea present. No nose lacerations, sinus tenderness, nasal deformity, septal deviation or nasal septal hematoma. No epistaxis.  No foreign bodies. Right sinus exhibits no maxillary sinus tenderness and no frontal sinus tenderness. Left sinus exhibits no maxillary sinus tenderness and no frontal sinus tenderness.  Mouth/Throat: Uvula is midline and mucous membranes are normal. Mucous membranes are not pale, not dry and not cyanotic. She does not have dentures. No oral lesions. No trismus in the jaw. Normal dentition. No dental abscesses, uvula swelling, lacerations or dental caries. Posterior oropharyngeal edema and posterior oropharyngeal erythema present. No oropharyngeal exudate or tonsillar abscesses.  Cobblestoning posterior pharynx; bilateral TMs with air fluid level clear; bilateral nasal turbinates with edema/erythema/clear discharge; bilateral allergic shiners  Eyes: Conjunctivae, EOM and lids are normal. Pupils are equal, round, and reactive to light. Right eye exhibits no chemosis, no discharge, no exudate and no hordeolum. No foreign body present in the right eye. Left eye exhibits no chemosis, no discharge, no exudate and no  hordeolum. No foreign body present in the left eye. Right conjunctiva is not injected. Right conjunctiva has no hemorrhage. Left conjunctiva is not injected. Left conjunctiva has no hemorrhage. No scleral icterus. Right eye exhibits normal extraocular motion and no nystagmus. Left eye exhibits normal extraocular motion and no nystagmus. Right pupil is round and reactive. Left pupil is round and reactive. Pupils are equal.  Neck: Trachea normal and normal range of motion. Neck supple. No tracheal tenderness, no spinous process tenderness and no muscular tenderness present. No neck rigidity. No tracheal deviation, no edema, no erythema and normal range of motion present. No thyroid mass and no thyromegaly present.  Cardiovascular: Normal rate, regular rhythm, S1 normal, S2 normal, normal heart sounds and intact distal pulses.  PMI is not displaced.  Exam reveals no gallop and no friction rub.   No murmur heard. Pulmonary/Chest: Effort normal and breath sounds normal. No accessory muscle usage or stridor. No respiratory distress. She has no decreased breath sounds. She has no wheezes. She has no rhonchi. She has no rales. She exhibits no tenderness.  Abdominal: Soft. She exhibits no distension.  Musculoskeletal: Normal range of motion. She exhibits no edema or tenderness.       Right shoulder: Normal.       Left shoulder: Normal.       Right hip: Normal.       Left hip: Normal.       Right knee: Normal.       Left knee: Normal.  Cervical back: Normal.       Right hand: Normal.       Left hand: Normal.  Lymphadenopathy:       Head (right side): No submental, no submandibular, no tonsillar, no preauricular, no posterior auricular and no occipital adenopathy present.       Head (left side): No submental, no submandibular, no tonsillar, no preauricular, no posterior auricular and no occipital adenopathy present.    She has no cervical adenopathy.       Right cervical: No superficial cervical, no  deep cervical and no posterior cervical adenopathy present.      Left cervical: No superficial cervical, no deep cervical and no posterior cervical adenopathy present.  Neurological: She is alert and oriented to person, place, and time. She has normal strength. She is not disoriented. She displays no atrophy and no tremor. No cranial nerve deficit or sensory deficit. She exhibits normal muscle tone. She displays no seizure activity. Coordination and gait normal. GCS eye subscore is 4. GCS verbal subscore is 5. GCS motor subscore is 6.  Skin: Skin is warm, dry and intact. No abrasion, no bruising, no burn, no ecchymosis, no laceration, no lesion, no petechiae and no rash noted. She is not diaphoretic. No cyanosis or erythema. No pallor. Nails show no clubbing.  Psychiatric: She has a normal mood and affect. Her speech is normal and behavior is normal. Judgment and thought content normal. Cognition and memory are normal.  Nursing note and vitals reviewed.   Urgent Care Course   Clinical Course    Procedures (including critical care time)  Labs Review Labs Reviewed - No data to display  Imaging Review No results found.     MDM   1. Otitis media with effusion, bilateral   2. Rhinosinusitis    Patient may use normal saline nasal spray as needed.  Consider antihistamine or nasal steroid use e.g. Zyrtec or claritin 10mg  po daily and flonase or nasacort 1 spray each nostril BID.  Avoid triggers if possible.  Shower prior to bedtime if exposed to triggers.  If allergic dust/dust mites recommend mattress/pillow covers/encasements; washing linens, vacuuming, sweeping, dusting weekly.  Call or return to clinic as needed if these symptoms worsen or fail to improve as anticipated.   Exitcare handout on allergic rhinitis given to patient.  Patient verbalized understanding of instructions, agreed with plan of care and had no further questions at this time.  P2:  Avoidance and hand  washing.  Supportive treatment.   No evidence of invasive bacterial infection, non toxic and well hydrated.  This is most likely self limiting viral infection.  I do not see where any further testing or imaging is necessary at this time.   I will suggest supportive care, rest, good hygiene and encourage the patient to take adequate fluids.  The patient is to return to clinic or EMERGENCY ROOM if symptoms worsen or change significantly e.g. ear pain, fever, purulent discharge from ears or bleeding.  Exitcare handout on otitis media with effusion given to patient.  Patient verbalized agreement and understanding of treatment plan.     Start nasacort or flonase 1 spray each nostril BID, saline 2 sprays each nostril q2h prn congestion.  If no improvement with 48 hours of saline and flonase use start doxycycline 100mg  po BID x 10 days.  Rx given. Motrin 800mg  po TID prn pain/fever.  No evidence of systemic bacterial infection, non toxic and well hydrated.  I do not see where any  further testing or imaging is necessary at this time.   I will suggest supportive care, rest, good hygiene and encourage the patient to take adequate fluids.  The patient is to return to clinic or EMERGENCY ROOM if symptoms worsen or change significantly.  Exitcare handout on sinusitis given to patient.  Patient verbalized agreement and understanding of treatment plan and had no further questions at this time.   P2:  Hand washing and cover cough    Barbaraann Barthel, NP 07/18/16 1102

## 2016-11-04 ENCOUNTER — Ambulatory Visit
Admission: EM | Admit: 2016-11-04 | Discharge: 2016-11-04 | Disposition: A | Discharge status: Home / Self Care | Payer: 59 | Attending: Family Medicine | Admitting: Family Medicine

## 2016-11-04 DIAGNOSIS — L0291 Cutaneous abscess, unspecified: Secondary | ICD-10-CM

## 2016-11-04 DIAGNOSIS — L02211 Cutaneous abscess of abdominal wall: Secondary | ICD-10-CM

## 2016-11-04 MED ORDER — SULFAMETHOXAZOLE-TRIMETHOPRIM 800-160 MG PO TABS: 1.0000 | ORAL_TABLET | Freq: Two times a day (BID) | ORAL | 0 refills | Status: DC

## 2016-11-04 MED ORDER — MUPIROCIN 2 % EX OINT: 1.0000 "application " | TOPICAL_OINTMENT | Freq: Three times a day (TID) | CUTANEOUS | 0 refills | Status: DC

## 2016-11-04 NOTE — ED Provider Notes (Signed)
[facility]    CSN: [id number] Arrival date & time: 11/04/16  1814     History   Chief Complaint Chief Complaint  Patient presents with  . Abscess    abdomen    HPI Susan Stevenson is a 35 y.o. female.   Patient reports she has eczema on her lower right abdomen. Over the weekend she had more pruritus and she's been scratching it more. After scratching it for a while she noticed redness and swelling in that area on Sunday. She reports the area became thick and large. Poorly over the last 5 days is opened she did develop a scab on it and the scab and weighs she scratches more she's got drainage from it. She denies any fever she has some tenderness and pain mostly drainage from that area.. She does not smoke. Previous surgery is C-section she is gravida 3 para 2. No pertinent family medical history pertaining to today's visit.   The history is provided by the patient. No language interpreter was used.  Abscess  Location:  Torso Torso abscess location:  Abd RLQ Abscess quality: draining, induration, painful and redness   Red streaking: yes   Progression:  Partially resolved Pain details:    Quality:  Pressure, throbbing, sharp and shooting   Severity:  Moderate   Duration:  5 days   Timing:  Constant   Progression:  Partially resolved Chronicity:  New Relieved by:  Nothing Worsened by:  Nothing Ineffective treatments:  None tried Associated symptoms: no anorexia, no fatigue, no fever, no headaches and no vomiting   Risk factors: no family hx of MRSA, no hx of MRSA and no prior abscess     History reviewed. No pertinent past medical history.  There are no active problems to display for this patient.   Past Surgical History:  Procedure Laterality Date  . CESAREAN SECTION N/A 05/30/2015   Procedure: CESAREAN SECTION ;  Surgeon: [name], MD;  Location: [facility] OR[facility];  Service: Obstetrics;  Laterality: N/A;    OB History    Gravida Para Term Preterm  AB Living   3 2 2     1    SAB TAB Ectopic Multiple Live Births         0 1       Home Medications    Prior to Admission medications   Medication Sig Start Date End Date Taking? Authorizing Provider  fluticasone (FLONASE) 50 MCG/ACT nasal spray Place 1 spray into both nostrils 2 (two) times daily. 07/18/16 11/04/16 Yes Barbaraann Barthel, NP  ibuprofen (ADVIL,MOTRIN) 800 MG tablet Take 1 tablet (800 mg total) by mouth 3 (three) times daily. 07/18/16  Yes Barbaraann Barthel, NP  loratadine (CLARITIN) 10 MG tablet Take 1 tablet (10 mg total) by mouth daily. 07/18/16  Yes Barbaraann Barthel, NP  norethindrone-ethinyl estradiol (JUNEL FE,GILDESS FE,LOESTRIN FE) 1-20 MG-MCG tablet Take 1 tablet by mouth daily.   Yes Historical Provider, MD  mupirocin ointment (BACTROBAN) 2 % Apply 1 application topically 3 (three) times daily. 11/04/16   Hassan Rowan, MD  sodium chloride (OCEAN) 0.65 % SOLN nasal spray Place 2 sprays into both nostrils every 2 (two) hours while awake. 07/18/16 08/17/16  Barbaraann Barthel, NP  sulfamethoxazole-trimethoprim (BACTRIM DS,SEPTRA DS) 800-160 MG tablet Take 1 tablet by mouth 2 (two) times daily. 11/04/16   Hassan Rowan, MD    Family History History reviewed. No pertinent family history.  Social History Social History  Substance Use Topics  .  Smoking status: Never Smoker  . Smokeless tobacco: Never Used  . Alcohol use No     Allergies   Penicillins and Percocet [oxycodone-acetaminophen]   Review of Systems Review of Systems  Constitutional: Negative for fatigue and fever.  Gastrointestinal: Negative for anorexia and vomiting.  Skin: Positive for color change, rash and wound.  Neurological: Negative for headaches.     Physical Exam Triage Vital Signs ED Triage Vitals  Enc Vitals Group     BP 11/04/16 1840 (!) 164/106     Pulse Rate 11/04/16 1840 88     Resp 11/04/16 1840 18     Temp 11/04/16 1840 98 F (36.7 C)     Temp Source 11/04/16 1840 Oral     SpO2  11/04/16 1840 100 %     Weight 11/04/16 1839 200 lb (90.7 kg)     Height 11/04/16 1839 5\' 6"  (1.676 m)     Head Circumference --      Peak Flow --      Pain Score 11/04/16 1840 2     Pain Loc --      Pain Edu? --      Excl. in GC? --    No data found.   Updated Vital Signs BP (!) 164/106 (BP Location: Left Arm)   Pulse 88   Temp 98 F (36.7 C) (Oral)   Resp 18   Ht 5\' 6"  (1.676 m)   Wt 200 lb (90.7 kg)   LMP 10/11/2016   SpO2 100%   BMI 32.28 kg/m   Visual Acuity Right Eye Distance:   Left Eye Distance:   Bilateral Distance:    Right Eye Near:   Left Eye Near:    Bilateral Near:     Physical Exam  Constitutional: She is oriented to person, place, and time. She appears well-developed and well-nourished. No distress.  HENT:  Head: Normocephalic.  Eyes: Pupils are equal, round, and reactive to light.  Neck: Normal range of motion. Neck supple.  Pulmonary/Chest: Effort normal.  Abdominal: Soft. There is tenderness.  Musculoskeletal: Normal range of motion. She exhibits no edema or deformity.  Neurological: She is alert and oriented to person, place, and time. No cranial nerve deficit.  Skin: Skin is warm. Rash noted. There is erythema.  Psychiatric: She has a normal mood and affect.  Vitals reviewed.    UC Treatments / Results  Labs (all labs ordered are listed, but only abnormal results are displayed) Labs Reviewed  AEROBIC CULTURE (SUPERFICIAL SPECIMEN)    EKG  EKG Interpretation None       Radiology No results found.  Procedures Procedures (including critical care time)  Medications Ordered in UC Medications - No data to display   Initial Impression / Assessment and Plan / UC Course  I have reviewed the triage vital signs and the nursing notes.  Pertinent labs & imaging results that were available during my care of the patient were reviewed by me and considered in my medical decision making (see chart for details).  Clinical Course     Some pus was expressed from the wound site. The wound site is open and draining culture was obtained from the pus elicited. Mild tenderness present. The cellulitis is localized around the open abscess is draining. We'll place patient on Septra DS 1 tablet twice a day back pain ointment to apply to the area 3 times a day. Patient's last list. Was 2 weeks ago. Work note for tomorrow if needed. PCP in  2 weeks as well.   Final Clinical Impressions(s) / UC Diagnoses   Final diagnoses:  Abscess  Abscess of skin of abdomen    New Prescriptions Discharge Medication List as of 11/04/2016  7:20 PM    START taking these medications   Details  mupirocin ointment (BACTROBAN) 2 % Apply 1 application topically 3 (three) times daily., Starting Thu 11/04/2016, Normal         Hassan RowanEugene Hyatt Capobianco, MD 11/04/16 [phone]

## 2016-11-04 NOTE — ED Triage Notes (Signed)
Patient had a bump show on her tummy on Sunday, and she has a rash on tummy that is normal there and it itches and this bump came up and it popped and it has drainage coming out.

## 2016-11-07 LAB — AEROBIC CULTURE W GRAM STAIN (SUPERFICIAL SPECIMEN)

## 2016-12-21 DIAGNOSIS — Z01419 Encounter for gynecological examination (general) (routine) without abnormal findings: Secondary | ICD-10-CM | POA: Diagnosis not present

## 2017-01-27 DIAGNOSIS — J0100 Acute maxillary sinusitis, unspecified: Secondary | ICD-10-CM | POA: Diagnosis not present

## 2017-02-28 ENCOUNTER — Ambulatory Visit (INDEPENDENT_AMBULATORY_CARE_PROVIDER_SITE_OTHER): Payer: 59 | Admitting: Obstetrics and Gynecology

## 2017-02-28 ENCOUNTER — Encounter: Payer: Self-pay | Admitting: Obstetrics and Gynecology

## 2017-02-28 ENCOUNTER — Ambulatory Visit: Payer: Self-pay | Admitting: Obstetrics and Gynecology

## 2017-02-28 VITALS — BP 140/96 | HR 86 | Ht 65.0 in | Wt 195.0 lb

## 2017-02-28 DIAGNOSIS — Z6832 Body mass index (BMI) 32.0-32.9, adult: Secondary | ICD-10-CM | POA: Diagnosis not present

## 2017-02-28 DIAGNOSIS — E669 Obesity, unspecified: Secondary | ICD-10-CM

## 2017-02-28 DIAGNOSIS — O1002 Pre-existing essential hypertension complicating childbirth: Secondary | ICD-10-CM | POA: Diagnosis not present

## 2017-02-28 MED ORDER — NORETHINDRONE ACETATE 5 MG PO TABS: 5.0000 mg | ORAL_TABLET | Freq: Every day | ORAL | 11 refills | Status: DC

## 2017-02-28 MED ORDER — LORCASERIN HCL ER 20 MG PO TB24: 1.0000 | ORAL_TABLET | Freq: Every day | ORAL | 2 refills | Status: DC

## 2017-02-28 NOTE — Progress Notes (Signed)
Gynecology Office Visit  Chief Complaint:  Chief Complaint  Patient presents with  . Weight Check    History of Present Illness: Patientis a 36 y.o. G52P2001 female, who presents for the evaluation of the desire to lose weight. She has gained 1 pounds. The patient states the following symptoms since starting her weight loss therapy: appetite suppression, energy, and weight loss.  The patient also reports no other ill effects. The patient specifically denies heart palpitations, anxiety, and insomnia.    Review of Systems: 10 point review of systems negative unless otherwise noted in HPI  Past Medical History:  History reviewed. No pertinent past medical history.  Past Surgical History:  Past Surgical History:  Procedure Laterality Date  . CESAREAN SECTION N/A 05/30/2015   Procedure: CESAREAN SECTION ;  Surgeon: [name], MD;  Location: [facility] OR[facility];  Service: Obstetrics;  Laterality: N/A;    Gynecologic History: Patient's last menstrual period was 02/01/2017 (approximate).  Obstetric History: G3P2001  Family History:  History reviewed. No pertinent family history.  Social History:  Social History   Social History  . Marital status: Married    Spouse name: N/A  . Number of children: N/A  . Years of education: N/A   Occupational History  . Not on file.   Social History Main Topics  . Smoking status: Never Smoker  . Smokeless tobacco: Never Used  . Alcohol use No  . Drug use: No  . Sexual activity: Yes    Birth control/ protection: Pill   Other Topics Concern  . Not on file   Social History Narrative  . No narrative on file    Allergies:  Allergies  Allergen Reactions  . Penicillins Anaphylaxis  . Percocet [Oxycodone-Acetaminophen] Other (See Comments)    migraine    Medications: Prior to Admission medications   Medication Sig Start Date End Date Taking? Authorizing Provider  phentermine (ADIPEX-P) 37.5 MG tablet Take 37.5 mg by mouth daily  before breakfast.   Yes [name], MD  norethindrone-ethinyl estradiol (JUNEL FE,GILDESS FE,LOESTRIN FE) 1-20 MG-MCG tablet Take 1 tablet by mouth daily.    Historical Provider, MD    Physical Exam Vitals:  Vitals:   02/28/17 0838  BP: (!) 140/96  Pulse: 86   Patient's last menstrual period was 02/01/2017 (approximate). Body mass index is 32.45 kg/m. Filed Weights   02/28/17 0838  Weight: 195 lb (88.5 kg)    General: NAD HEENT: normocephalic, anicteric Thyroid: no enlargement Pulmonary: no increased work of breathing Neurologic: Grossly intact Psychiatric: mood appropriate, affect full  Assessment: 36 y.o. G3P2001 follow up for medical weight loss  Plan: Problem List Items Addressed This Visit    None    Visit Diagnoses    Obesity (BMI 30-39.9)    -  Primary   Relevant Medications   phentermine (ADIPEX-P) 37.5 MG tablet   Lorcaserin HCl ER (BELVIQ XR) 20 MG TB24   BMI 32.0-32.9,adult       Benign essential hypertension with delivery          1) 1500 Calorie ADA Diet  2) Patient education given regarding appropriate lifestyle changes for weight loss including: regular physical activity, healthy coping strategies, caloric restriction and healthy eating patterns.  3) Patient will be started on weight loss medication. The risks and benefits and side effects of medication, such as Adipex (Phenteramine) ,  Tenuate (Diethylproprion), Belviq (lorcarsin), Contrave (buproprion/naltrexone), Qsymia (phentermine/topiramate), and Saxenda (liraglutide) is discussed. The pros and cons of suppressing appetite and  boosting metabolism is discussed. Risks of tolerence and addiction is discussed for selected agents discussed. Use of medicine will ne short term, such as 3-4 months at a time followed by a period of time off of the medicine to avoid these risks and side effects for Adipex, Qsymia, and Tenuate discussed. Pt to call with any negative side effects and agrees to keep follow  up appts.  4) Patient to take medication, with the benefits of appetite suppression and metabolism boost d/w pt, along with the side effects and risk factors of long term use that will be avoided with our use of short bursts of therapy. Rx provided with switch to belviq   5) 15 minutes face-to-face; with counseling/coordination of care > 50 percent of visit related to obesity and ongoing management/treatment   6) Follow up in 12 weeks to assess response  7) Switch to norethindrone  tab for continued elevation in blood pressure

## 2017-02-28 NOTE — Patient Instructions (Signed)
Calorie Counting for Weight Loss Calories are units of energy. Your body needs a certain amount of calories from food to keep you going throughout the day. When you eat more calories than your body needs, your body stores the extra calories as fat. When you eat fewer calories than your body needs, your body burns fat to get the energy it needs. Calorie counting means keeping track of how many calories you eat and drink each day. Calorie counting can be helpful if you need to lose weight. If you make sure to eat fewer calories than your body needs, you should lose weight. Ask your health care provider what a healthy weight is for you. For calorie counting to work, you will need to eat the right number of calories in a day in order to lose a healthy amount of weight per week. A dietitian can help you determine how many calories you need in a day and will give you suggestions on how to reach your calorie goal.  A healthy amount of weight to lose per week is usually 1-2 lb (0.5-0.9 kg). This usually means that your daily calorie intake should be reduced by 500-750 calories.  Eating 1,200 - 1,500 calories per day can help most women lose weight.  Eating 1,500 - 1,800 calories per day can help most men lose weight. What is my plan? My goal is to have __________ calories per day. If I have this many calories per day, I should lose around __________ pounds per week. What do I need to know about calorie counting? In order to meet your daily calorie goal, you will need to:  Find out how many calories are in each food you would like to eat. Try to do this before you eat.  Decide how much of the food you plan to eat.  Write down what you ate and how many calories it had. Doing this is called keeping a food log. To successfully lose weight, it is important to balance calorie counting with a healthy lifestyle that includes regular activity. Aim for 150 minutes of moderate exercise (such as walking) or 75  minutes of vigorous exercise (such as running) each week. Where do I find calorie information?   The number of calories in a food can be found on a Nutrition Facts label. If a food does not have a Nutrition Facts label, try to look up the calories online or ask your dietitian for help. Remember that calories are listed per serving. If you choose to have more than one serving of a food, you will have to multiply the calories per serving by the amount of servings you plan to eat. For example, the label on a package of bread might say that a serving size is 1 slice and that there are 90 calories in a serving. If you eat 1 slice, you will have eaten 90 calories. If you eat 2 slices, you will have eaten 180 calories. How do I keep a food log? Immediately after each meal, record the following information in your food log:  What you ate. Don't forget to include toppings, sauces, and other extras on the food.  How much you ate. This can be measured in cups, ounces, or number of items.  How many calories each food and drink had.  The total number of calories in the meal. Keep your food log near you, such as in a small notebook in your pocket, or use a mobile app or website. Some programs will   calculate calories for you and show you how many calories you have left for the day to meet your goal. What are some calorie counting tips?  Use your calories on foods and drinks that will fill you up and not leave you hungry:  Some examples of foods that fill you up are nuts and nut butters, vegetables, lean proteins, and high-fiber foods like whole grains. High-fiber foods are foods with more than 5 g fiber per serving.  Drinks such as sodas, specialty coffee drinks, alcohol, and juices have a lot of calories, yet do not fill you up.  Eat nutritious foods and avoid empty calories. Empty calories are calories you get from foods or beverages that do not have many vitamins or protein, such as candy, sweets, and  soda. It is better to have a nutritious high-calorie food (such as an avocado) than a food with few nutrients (such as a bag of chips).  Know how many calories are in the foods you eat most often. This will help you calculate calorie counts faster.  Pay attention to calories in drinks. Low-calorie drinks include water and unsweetened drinks.  Pay attention to nutrition labels for "low fat" or "fat free" foods. These foods sometimes have the same amount of calories or more calories than the full fat versions. They also often have added sugar, starch, or salt, to make up for flavor that was removed with the fat.  Find a way of tracking calories that works for you. Get creative. Try different apps or programs if writing down calories does not work for you. What are some portion control tips?  Know how many calories are in a serving. This will help you know how many servings of a certain food you can have.  Use a measuring cup to measure serving sizes. You could also try weighing out portions on a kitchen scale. With time, you will be able to estimate serving sizes for some foods.  Take some time to put servings of different foods on your favorite plates, bowls, and cups so you know what a serving looks like.  Try not to eat straight from a bag or box. Doing this can lead to overeating. Put the amount you would like to eat in a cup or on a plate to make sure you are eating the right portion.  Use smaller plates, glasses, and bowls to prevent overeating.  Try not to multitask (for example, watch TV or use your computer) while eating. If it is time to eat, sit down at a table and enjoy your food. This will help you to know when you are full. It will also help you to be aware of what you are eating and how much you are eating. What are tips for following this plan? Reading food labels   Check the calorie count compared to the serving size. The serving size may be smaller than what you are used to  eating.  Check the source of the calories. Make sure the food you are eating is high in vitamins and protein and low in saturated and trans fats. Shopping   Read nutrition labels while you shop. This will help you make healthy decisions before you decide to purchase your food.  Make a grocery list and stick to it. Cooking   Try to cook your favorite foods in a healthier way. For example, try baking instead of frying.  Use low-fat dairy products. Meal planning   Use more fruits and vegetables. Half of your   plate should be fruits and vegetables.  Include lean proteins like poultry and fish. How do I count calories when eating out?  Ask for smaller portion sizes.  Consider sharing an entree and sides instead of getting your own entree.  If you get your own entree, eat only half. Ask for a box at the beginning of your meal and put the rest of your entree in it so you are not tempted to eat it.  If calories are listed on the menu, choose the lower calorie options.  Choose dishes that include vegetables, fruits, whole grains, low-fat dairy products, and lean protein.  Choose items that are boiled, broiled, grilled, or steamed. Stay away from items that are buttered, battered, fried, or served with cream sauce. Items labeled "crispy" are usually fried, unless stated otherwise.  Choose water, low-fat milk, unsweetened iced tea, or other drinks without added sugar. If you want an alcoholic beverage, choose a lower calorie option such as a glass of wine or light beer.  Ask for dressings, sauces, and syrups on the side. These are usually high in calories, so you should limit the amount you eat.  If you want a salad, choose a garden salad and ask for grilled meats. Avoid extra toppings like bacon, cheese, or fried items. Ask for the dressing on the side, or ask for olive oil and vinegar or lemon to use as dressing.  Estimate how many servings of a food you are given. For example, a serving  of cooked rice is  cup or about the size of half a baseball. Knowing serving sizes will help you be aware of how much food you are eating at restaurants. The list below tells you how big or small some common portion sizes are based on everyday objects:  1 oz-4 stacked dice.  3 oz-1 deck of cards.  1 tsp-1 die.  1 Tbsp- a ping-pong ball.  2 Tbsp-1 ping-pong ball.   cup- baseball.  1 cup-1 baseball. Summary  Calorie counting means keeping track of how many calories you eat and drink each day. If you eat fewer calories than your body needs, you should lose weight.  A healthy amount of weight to lose per week is usually 1-2 lb (0.5-0.9 kg). This usually means reducing your daily calorie intake by 500-750 calories.  The number of calories in a food can be found on a Nutrition Facts label. If a food does not have a Nutrition Facts label, try to look up the calories online or ask your dietitian for help.  Use your calories on foods and drinks that will fill you up, and not on foods and drinks that will leave you hungry.  Use smaller plates, glasses, and bowls to prevent overeating. This information is not intended to replace advice given to you by your health care provider. Make sure you discuss any questions you have with your health care provider. Document Released: 11/15/2005 Document Revised: 10/15/2016 Document Reviewed: 10/15/2016 Elsevier Interactive Patient Education  2017 [employer]  Exercising to Lose Weight Exercising can help you to lose weight. In order to lose weight through exercise, you need to do vigorous-intensity exercise. You can tell that you are exercising with vigorous intensity if you are breathing very hard and fast and cannot hold a conversation while exercising. Moderate-intensity exercise helps to maintain your current weight. You can tell that you are exercising at a moderate level if you have a higher heart rate and faster breathing, but you are still    able to hold a conversation. How often should I exercise? Choose an activity that you enjoy and set realistic goals. Your health care provider can help you to make an activity plan that works for you. Exercise regularly as directed by your health care provider. This may include:  Doing resistance training twice each week, such as:  Push-ups.  Sit-ups.  Lifting weights.  Using resistance bands.  Doing a given intensity of exercise for a given amount of time. Choose from these options:  150 minutes of moderate-intensity exercise every week.  75 minutes of vigorous-intensity exercise every week.  A mix of moderate-intensity and vigorous-intensity exercise every week. Children, pregnant women, people who are out of shape, people who are overweight, and older adults may need to consult a health care provider for individual recommendations. If you have any sort of medical condition, be sure to consult your health care provider before starting a new exercise program. What are some activities that can help me to lose weight?  Walking at a rate of at least 4.5 miles an hour.  Jogging or running at a rate of 5 miles per hour.  Biking at a rate of at least 10 miles per hour.  Lap swimming.  Roller-skating or in-line skating.  Cross-country skiing.  Vigorous competitive sports, such as football, basketball, and soccer.  Jumping rope.  Aerobic dancing. How can I be more active in my day-to-day activities?  Use the stairs instead of the elevator.  Take a walk during your lunch break.  If you drive, park your car farther away from work or school.  If you take public transportation, get off one stop early and walk the rest of the way.  Make all of your phone calls while standing up and walking around.  Get up, stretch, and walk around every 30 minutes throughout the day. What guidelines should I follow while exercising?  Do not exercise so much that you hurt yourself, feel  dizzy, or get very short of breath.  Consult your health care provider prior to starting a new exercise program.  Wear comfortable clothes and shoes with good support.  Drink plenty of water while you exercise to prevent dehydration or heat stroke. Body water is lost during exercise and must be replaced.  Work out until you breathe faster and your heart beats faster. This information is not intended to replace advice given to you by your health care provider. Make sure you discuss any questions you have with your health care provider. Document Released: 12/18/2010 Document Revised: 04/22/2016 Document Reviewed: 04/18/2014 Elsevier Interactive Patient Education  2017 Elsevier Inc.  

## 2017-03-01 ENCOUNTER — Encounter: Payer: Self-pay | Admitting: Obstetrics and Gynecology

## 2017-03-22 ENCOUNTER — Encounter: Payer: Self-pay | Admitting: Obstetrics and Gynecology

## 2017-03-28 ENCOUNTER — Other Ambulatory Visit: Payer: Self-pay | Admitting: Student

## 2017-03-28 DIAGNOSIS — M25562 Pain in left knee: Secondary | ICD-10-CM

## 2017-03-29 ENCOUNTER — Other Ambulatory Visit: Payer: Self-pay | Admitting: Obstetrics and Gynecology

## 2017-03-29 MED ORDER — ESCITALOPRAM OXALATE 10 MG PO TABS: 10.0000 mg | ORAL_TABLET | Freq: Every day | ORAL | 3 refills | Status: DC

## 2017-03-29 MED ORDER — HYDROXYZINE HCL 25 MG PO TABS: 25.0000 mg | ORAL_TABLET | Freq: Four times a day (QID) | ORAL | 2 refills | Status: DC | PRN

## 2017-04-07 ENCOUNTER — Ambulatory Visit
Admission: RE | Admit: 2017-04-07 | Discharge: 2017-04-07 | Disposition: A | Discharge status: Home / Self Care | Payer: 59 | Source: Ambulatory Visit | Attending: Student | Admitting: Student

## 2017-04-07 DIAGNOSIS — R600 Localized edema: Secondary | ICD-10-CM | POA: Diagnosis not present

## 2017-04-07 DIAGNOSIS — M7122 Synovial cyst of popliteal space [Baker], left knee: Secondary | ICD-10-CM | POA: Diagnosis not present

## 2017-04-07 DIAGNOSIS — M25562 Pain in left knee: Secondary | ICD-10-CM | POA: Diagnosis present

## 2017-05-26 ENCOUNTER — Ambulatory Visit: Payer: 59 | Admitting: Obstetrics and Gynecology

## 2017-08-05 ENCOUNTER — Other Ambulatory Visit: Payer: Self-pay | Admitting: Obstetrics and Gynecology

## 2017-08-05 NOTE — Telephone Encounter (Signed)
Please advise, Annual isn't due until 11/2017

## 2017-08-26 DIAGNOSIS — R030 Elevated blood-pressure reading, without diagnosis of hypertension: Secondary | ICD-10-CM | POA: Diagnosis not present

## 2017-10-06 DIAGNOSIS — R61 Generalized hyperhidrosis: Secondary | ICD-10-CM | POA: Diagnosis not present

## 2017-10-06 DIAGNOSIS — Z13220 Encounter for screening for lipoid disorders: Secondary | ICD-10-CM | POA: Diagnosis not present

## 2017-10-06 DIAGNOSIS — R079 Chest pain, unspecified: Secondary | ICD-10-CM | POA: Diagnosis not present

## 2017-10-06 DIAGNOSIS — Z79899 Other long term (current) drug therapy: Secondary | ICD-10-CM | POA: Diagnosis not present

## 2017-10-06 DIAGNOSIS — Z0000 Encounter for general adult medical examination without abnormal findings: Secondary | ICD-10-CM | POA: Diagnosis not present

## 2017-10-26 DIAGNOSIS — R002 Palpitations: Secondary | ICD-10-CM | POA: Diagnosis not present

## 2017-11-16 DIAGNOSIS — R002 Palpitations: Secondary | ICD-10-CM | POA: Diagnosis not present

## 2017-11-16 DIAGNOSIS — R0789 Other chest pain: Secondary | ICD-10-CM | POA: Diagnosis not present

## 2017-11-30 DIAGNOSIS — R030 Elevated blood-pressure reading, without diagnosis of hypertension: Secondary | ICD-10-CM | POA: Diagnosis not present

## 2017-11-30 DIAGNOSIS — R55 Syncope and collapse: Secondary | ICD-10-CM | POA: Diagnosis not present

## 2018-01-06 DIAGNOSIS — J019 Acute sinusitis, unspecified: Secondary | ICD-10-CM | POA: Diagnosis not present

## 2018-02-01 DIAGNOSIS — R600 Localized edema: Secondary | ICD-10-CM | POA: Diagnosis not present

## 2018-02-01 DIAGNOSIS — I10 Essential (primary) hypertension: Secondary | ICD-10-CM | POA: Diagnosis not present

## 2018-05-14 ENCOUNTER — Other Ambulatory Visit: Payer: Self-pay | Admitting: Obstetrics and Gynecology

## 2018-05-15 NOTE — Telephone Encounter (Signed)
Needs follow-up

## 2018-09-10 ENCOUNTER — Other Ambulatory Visit: Payer: Self-pay | Admitting: Obstetrics and Gynecology

## 2018-10-12 ENCOUNTER — Other Ambulatory Visit: Payer: Self-pay | Admitting: Obstetrics and Gynecology

## 2018-10-30 ENCOUNTER — Other Ambulatory Visit: Payer: Self-pay | Admitting: Obstetrics and Gynecology

## 2018-10-30 ENCOUNTER — Telehealth: Payer: Self-pay

## 2018-10-30 MED ORDER — NORETHINDRONE ACETATE 5 MG PO TABS: 5.0000 mg | ORAL_TABLET | Freq: Every day | ORAL | 0 refills | Status: DC

## 2018-10-30 NOTE — Telephone Encounter (Signed)
sent 

## 2018-10-30 NOTE — Telephone Encounter (Signed)
Pt has scheduled AE for 11/2018. She is requesting refill of her rx that was denied 10/12/18. Cb#[id number]

## 2018-10-31 NOTE — Telephone Encounter (Signed)
LMVM to notify pt. 

## 2018-12-11 ENCOUNTER — Encounter: Payer: Self-pay | Admitting: Obstetrics and Gynecology

## 2018-12-11 ENCOUNTER — Ambulatory Visit (INDEPENDENT_AMBULATORY_CARE_PROVIDER_SITE_OTHER): Payer: 59 | Admitting: Obstetrics and Gynecology

## 2018-12-11 VITALS — BP 128/90 | HR 88 | Ht 65.0 in | Wt 224.0 lb

## 2018-12-11 DIAGNOSIS — E669 Obesity, unspecified: Secondary | ICD-10-CM

## 2018-12-11 DIAGNOSIS — Z124 Encounter for screening for malignant neoplasm of cervix: Secondary | ICD-10-CM | POA: Diagnosis not present

## 2018-12-11 DIAGNOSIS — Z01419 Encounter for gynecological examination (general) (routine) without abnormal findings: Secondary | ICD-10-CM | POA: Diagnosis not present

## 2018-12-11 DIAGNOSIS — Z6837 Body mass index (BMI) 37.0-37.9, adult: Secondary | ICD-10-CM | POA: Diagnosis not present

## 2018-12-11 DIAGNOSIS — Z1239 Encounter for other screening for malignant neoplasm of breast: Secondary | ICD-10-CM

## 2018-12-11 MED ORDER — NORETHINDRONE ACETATE 5 MG PO TABS: 5.0000 mg | ORAL_TABLET | Freq: Every day | ORAL | 3 refills | Status: DC

## 2018-12-11 MED ORDER — PHENTERMINE HCL 37.5 MG PO TABS: 37.5000 mg | ORAL_TABLET | Freq: Every day | ORAL | 0 refills | Status: DC

## 2018-12-11 NOTE — Progress Notes (Signed)
Gynecology Annual Exam   PCP: Raynelle Bring  Chief Complaint:  Chief Complaint  Patient presents with  . Gynecologic Exam    History of Present Illness: Patient is a 38 y.o. G3P2001 presents for annual exam. The patient has no complaints today.   LMP: No LMP recorded. Absent on norethindrone  The patient is sexually active. She currently uses oral progesterone-only contraceptive for contraception. She denies dyspareunia.  The patient does perform self breast exams.  There is no notable family history of breast or ovarian cancer in her family.  The patient wears seatbelts: yes.   The patient has regular exercise: not asked.    The patient denies current symptoms of depression.     Patientis a 38 y.o. G59P2001 female, who presents for the evaluation of weight gain. She has gained 30 pounds primarily over 1.5 years. The patient states the following issues have contributed to her weight problem: none.  The patient has none additional symptoms. The patient specifically denies memory loss, muscle weakness, excessive thirst, and polyuria. Weight related co-morbidities include none (was changed to progestin only OCP because of elevated BP). The patient's past medical history is notable for none. She has tried phentermine interventions in the past with moderate success.   Review of Systems: Review of Systems  Constitutional: Negative for chills and fever.  HENT: Negative for congestion.   Respiratory: Negative for cough and shortness of breath.   Cardiovascular: Negative for chest pain and palpitations.  Gastrointestinal: Negative for abdominal pain, constipation, diarrhea, heartburn, nausea and vomiting.  Genitourinary: Negative for dysuria, frequency and urgency.  Skin: Negative for itching and rash.  Neurological: Negative for dizziness and headaches.  Endo/Heme/Allergies: Negative for polydipsia.  Psychiatric/Behavioral: Negative for depression.    Past Medical History:    Past Medical History:  Diagnosis Date  . Anxiety     Past Surgical History:  Past Surgical History:  Procedure Laterality Date  . CESAREAN SECTION N/A 05/30/2015   Procedure: CESAREAN SECTION ;  Surgeon: [name], MD;  Location: [facility] ORS;  Service: Obstetrics;  Laterality: N/A;    Gynecologic History:  No LMP recorded. Contraception: oral progesterone-only contraceptive Last Pap: Results were: 07/29/2015 NIL and HR HPV negative   Obstetric History: G3P2001  Family History:  History reviewed. No pertinent family history.  Social History:  Social History   Socioeconomic History  . Marital status: Married    Spouse name: Not on file  . Number of children: Not on file  . Years of education: Not on file  . Highest education level: Not on file  Occupational History  . Not on file  Social Needs  . Financial resource strain: Not on file  . Food insecurity:    Worry: Not on file    Inability: Not on file  . Transportation needs:    Medical: Not on file    Non-medical: Not on file  Tobacco Use  . Smoking status: Never Smoker  . Smokeless tobacco: Never Used  Substance and Sexual Activity  . Alcohol use: No  . Drug use: No  . Sexual activity: Yes    Birth control/protection: Pill  Lifestyle  . Physical activity:    Days per week: Not on file    Minutes per session: Not on file  . Stress: Not on file  Relationships  . Social connections:    Talks on phone: Not on file    Gets together: Not on file    Attends religious service: Not  on file    Active member of club or organization: Not on file    Attends meetings of clubs or organizations: Not on file    Relationship status: Not on file  . Intimate partner violence:    Fear of current or ex partner: Not on file    Emotionally abused: Not on file    Physically abused: Not on file    Forced sexual activity: Not on file  Other Topics Concern  . Not on file  Social History Narrative  . Not on file     Allergies:  Allergies  Allergen Reactions  . Penicillins Anaphylaxis  . Percocet [Oxycodone-Acetaminophen] Other (See Comments)    migraine    Medications: Prior to Admission medications   Medication Sig Start Date End Date Taking? Authorizing Provider  escitalopram (LEXAPRO) 10 MG tablet Take 1 tablet (10 mg total) by mouth daily. 03/29/17   [name], MD  hydrOXYzine (ATARAX/VISTARIL) 25 MG tablet Take 1 tablet (25 mg total) by mouth every 6 (six) hours as needed for anxiety. 03/29/17   [name], MD  Lorcaserin HCl ER (BELVIQ XR) 20 MG TB24 Take 1 tablet by mouth daily. 02/28/17   [name], MD  norethindrone (AYGESTIN) 5 MG tablet Take 1 tablet (5 mg total) by mouth daily. 10/30/18   [name], MD  phentermine (ADIPEX-P) 37.5 MG tablet Take 37.5 mg by mouth daily before breakfast.    [name], MD    Physical Exam Vitals: Blood pressure 128/90, pulse 88, height 5\' 5"  (1.651 m), weight 224 lb (101.6 kg).  Body mass index is 37.28 kg/m.   General: NAD HEENT: normocephalic, anicteric Thyroid: no enlargement, no palpable nodules Pulmonary: No increased work of breathing, CTAB Cardiovascular: RRR, distal pulses 2+ Breast: Breast symmetrical, no tenderness, no palpable nodules or masses, no skin or nipple retraction present, no nipple discharge.  No axillary or supraclavicular lymphadenopathy. Abdomen: NABS, soft, non-tender, non-distended.  Umbilicus without lesions.  No hepatomegaly, splenomegaly or masses palpable. No evidence of hernia  Genitourinary:  External: Normal external female genitalia.  Normal urethral meatus, normal Bartholin's and Skene's glands.    Vagina: Normal vaginal mucosa, no evidence of prolapse.    Cervix: Grossly normal in appearance, no bleeding  Uterus: Non-enlarged, mobile, normal contour.  No CMT  Adnexa: ovaries non-enlarged, no adnexal masses  Rectal: deferred  Lymphatic: no evidence of inguinal  lymphadenopathy Extremities: no edema, erythema, or tenderness Neurologic: Grossly intact Psychiatric: mood appropriate, affect full  Female chaperone present for pelvic and breast  portions of the physical exam    Assessment: 38 y.o. G3P2001 routine annual exam  Plan: Problem List Items Addressed This Visit    None    Visit Diagnoses    Encounter for gynecological examination without abnormal finding    -  Primary   Screening for malignant neoplasm of cervix       Relevant Orders   PapIG, HPV, rfx 16/18   Breast screening       Class 2 obesity without serious comorbidity with body mass index (BMI) of 37.0 to 37.9 in adult, unspecified obesity type       Relevant Medications   phentermine (ADIPEX-P) 37.5 MG tablet     Annual  1) STI screening  was notoffered and therefore not obtained  2)  ASCCP guidelines and rational discussed.  Patient opts for every 3 years screening interval  3) Contraception - the patient is currently using  oral progesterone-only contraceptive.  She is happy  with her current form of contraception and plans to continue - possibly interested in Mirena given information  4) Routine healthcare maintenance including cholesterol, diabetes screening discussed managed by PCP  Obesity  1) 1500 Calorie ADA Diet  2) Patient education given regarding appropriate lifestyle changes for weight loss including: regular physical activity, healthy coping strategies, caloric restriction and healthy eating patterns.  3) Patient will be started on weight loss medication. The risks and benefits and side effects of medication, such as Adipex (Phenteramine) ,  Tenuate (Diethylproprion), Belviq (lorcarsin), Contrave (buproprion/naltrexone), Qsymia (phentermine/topiramate), and Saxenda (liraglutide) is discussed. The pros and cons of suppressing appetite and boosting metabolism is discussed. Risks of tolerence and addiction is discussed for selected agents discussed. Use of  medicine will ne short term, such as 3-4 months at a time followed by a period of time off of the medicine to avoid these risks and side effects for Adipex, Qsymia, and Tenuate discussed. Pt to call with any negative side effects and agrees to keep follow up appts.  4) Encouraged weekly weight monitorig to track progress and sample 1 week food diary  5) Contraception - discussed that all weight loss drugs fall in to pregnancy category X, patient currently has reliable contraception in the form of norethindrone  6) 15 minutes face-to-face; counseling/coordination of care > 50 percent of visit  7) Follow up in 4 weeks to assess response     Vena AustriaAndreas Johnnathan Hagemeister, MD, Merlinda FrederickFACOG [facility], [facility] 12/11/2018, 9:10 AM

## 2018-12-21 LAB — PAPIG, HPV, RFX 16/18
HPV Genotype, 16: NEGATIVE
HPV Genotype, 18: NEGATIVE
HPV, high-risk: POSITIVE — AB

## 2018-12-22 ENCOUNTER — Ambulatory Visit
Admission: EM | Admit: 2018-12-22 | Discharge: 2018-12-22 | Disposition: A | Discharge status: Home / Self Care | Payer: 59 | Attending: Emergency Medicine | Admitting: Emergency Medicine

## 2018-12-22 ENCOUNTER — Other Ambulatory Visit: Payer: Self-pay

## 2018-12-22 ENCOUNTER — Encounter: Payer: Self-pay | Admitting: Emergency Medicine

## 2018-12-22 DIAGNOSIS — J069 Acute upper respiratory infection, unspecified: Secondary | ICD-10-CM | POA: Diagnosis not present

## 2018-12-22 DIAGNOSIS — B9789 Other viral agents as the cause of diseases classified elsewhere: Secondary | ICD-10-CM | POA: Insufficient documentation

## 2018-12-22 MED ORDER — HYDROCOD POLST-CPM POLST ER 10-8 MG/5ML PO SUER: 5.0000 mL | Freq: Every evening | ORAL | 0 refills | Status: DC | PRN

## 2018-12-22 MED ORDER — DOXYCYCLINE HYCLATE 100 MG PO CAPS: 100.0000 mg | ORAL_CAPSULE | Freq: Two times a day (BID) | ORAL | 0 refills | Status: DC

## 2018-12-22 NOTE — ED Triage Notes (Signed)
Patient c/o cough, headache, nasal congestion x 7 days.

## 2018-12-22 NOTE — ED Provider Notes (Signed)
[facility] ____________________________________________  Time seen: Approximately 9:48 AM  I have reviewed the triage vital signs and the nursing notes.   HISTORY  Chief Complaint Cough and Nasal Congestion   HPI Susan Stevenson is a 38 y.o. female presenting for evaluation of 1 week of runny nose, nasal congestion and cough.  States has not felt she has had a fever, but has felt very rundown and that the symptoms were quick in onset.  States her son now with similar complaints.  Left ear discomfort and congestion.  No current sore throat.  Cough disrupting sleep.  Unresolved with over-the-counter Alka-Seltzer cold medicine.  Decreased appetite, continues to drink fluids well.  Cough mostly nonproductive.  Occasional sinus pain.  Denies chest pain, shortness of breath, abdominal pain, extremity swelling or hemoptysis.  Reports otherwise doing well.  Denies recent sickness.  Patient states that her blood pressure is normally good, the lower numbers she states is sometimes around 90 but otherwise normal and just recently had her physical.   Past Medical History:  Diagnosis Date  . Anxiety     There are no active problems to display for this patient.   Past Surgical History:  Procedure Laterality Date  . CESAREAN SECTION N/A 05/30/2015   Procedure: CESAREAN SECTION ;  Surgeon: Vena AustriaAndreas Staebler, MD;  Location: [facility] ORS;  Service: Obstetrics;  Laterality: N/A;     No current facility-administered medications for this encounter.   Current Outpatient Medications:  .  FLUoxetine (PROZAC) 20 MG capsule, TAKE 1 CAPSULE BY MOUTH EVERY DAY, Disp: , Rfl:  .  norethindrone (AYGESTIN) 5 MG tablet, Take 1 tablet (5 mg total) by mouth daily., Disp: 90 tablet, Rfl: 3 .  phentermine (ADIPEX-P) 37.5 MG tablet, Take 1 tablet (37.5 mg total) by mouth daily before breakfast., Disp: 30 tablet, Rfl: 0 .  chlorpheniramine-HYDROcodone (TUSSIONEX PENNKINETIC ER) 10-8 MG/5ML SUER, Take 5  mLs by mouth at bedtime as needed for cough. do not drive or operate machinery while taking as can cause drowsiness., Disp: 50 mL, Rfl: 0 .  doxycycline (VIBRAMYCIN) 100 MG capsule, Take 1 capsule (100 mg total) by mouth 2 (two) times daily., Disp: 20 capsule, Rfl: 0  Allergies Penicillins and Percocet [oxycodone-acetaminophen]  History reviewed. No pertinent family history.  Social History Social History   Tobacco Use  . Smoking status: Never Smoker  . Smokeless tobacco: Never Used  Substance Use Topics  . Alcohol use: No  . Drug use: No    Review of Systems Constitutional: No fever/chills ENT:As above.  Cardiovascular: Denies chest pain. Respiratory: Denies shortness of breath. Gastrointestinal: No abdominal pain.  No nausea, no vomiting.  No diarrhea.   Genitourinary: Negative for dysuria. Musculoskeletal: Negative for back pain. Skin: Negative for rash.   ____________________________________________   PHYSICAL EXAM:  VITAL SIGNS: ED Triage Vitals  Enc Vitals Group     BP 12/22/18 0926 (!) 177/115     Pulse Rate 12/22/18 0926 93     Resp 12/22/18 0926 18     Temp 12/22/18 0926 98.8 F (37.1 C)     Temp Source 12/22/18 0926 Oral     SpO2 12/22/18 0926 99 %     Weight 12/22/18 0926 220 lb (99.8 kg)     Height 12/22/18 0926 5\' 5"  (1.651 m)     Head Circumference --      Peak Flow --      Pain Score 12/22/18 0925 3     Pain Loc --  Pain Edu? --      Excl. in GC? --    Vitals:   12/22/18 0926 12/22/18 0953  BP: (!) 177/115 (!) 152/98  Pulse: 93   Resp: 18   Temp: 98.8 F (37.1 C)   TempSrc: Oral   SpO2: 99%   Weight: 220 lb (99.8 kg)   Height: 5\' 5"  (1.651 m)     Constitutional: Alert and oriented. Well appearing and in no acute distress. Eyes: Conjunctivae are normal. Head: Atraumatic. No sinus tenderness to palpation. No swelling. No erythema.  Ears: no erythema, normal TMs bilaterally.   Nose:Nasal congestion with clear  rhinorrhea  Mouth/Throat: Mucous membranes are moist. No pharyngeal erythema. No tonsillar swelling or exudate.  Neck: No stridor.  No cervical spine tenderness to palpation. Hematological/Lymphatic/Immunilogical: No cervical lymphadenopathy. Cardiovascular: Normal rate, regular rhythm. Grossly normal heart sounds.  Good peripheral circulation. Respiratory: Normal respiratory effort.  No retractions. No wheezes.  Scattered rhonchi.  Occasional dry cough noted in room.  Good air movement.  Musculoskeletal: Ambulatory with steady gait. Neurologic:  Normal speech and language. No gait instability. Skin:  Skin appears warm, dry and intact. No rash noted. Psychiatric: Mood and affect are normal. Speech and behavior are normal. ___________________________________________   LABS (all labs ordered are listed, but only abnormal results are displayed)  Labs Reviewed - No data to display ____________________________________________  PROCEDURES Procedures    INITIAL IMPRESSION / ASSESSMENT AND PLAN / ED COURSE  Pertinent labs & imaging results that were available during my care of the patient were reviewed by me and considered in my medical decision making (see chart for details).  Overall well-appearing patient.  No acute distress.  Suspect recent viral upper respiratory infection, concern for secondary infection.  Will place patient on oral doxycycline, PRN Tussionex at night.  Over-the-counter during day.  Continue to monitor blood pressure, and counseled to currently stop phentermine as patient states that is a newer medication, and recommend follow-up with her doctor regarding her blood pressure prior to resuming.  Rest, fluids and supportive care.Discussed indication, risks and benefits of medications with patient.  Discussed follow up with Primary care physician this week. Discussed follow up and return parameters including no resolution or any worsening concerns. Patient verbalized  understanding and agreed to plan.   ____________________________________________   FINAL CLINICAL IMPRESSION(S) / ED DIAGNOSES  Final diagnoses:  Viral URI with cough     ED Discharge Orders         Ordered    doxycycline (VIBRAMYCIN) 100 MG capsule  2 times daily     12/22/18 0949    chlorpheniramine-HYDROcodone (TUSSIONEX PENNKINETIC ER) 10-8 MG/5ML SUER  At bedtime PRN     12/22/18 0037           Note: This dictation was prepared with Dragon dictation along with smaller phrase technology. Any transcriptional errors that result from this process are unintentional.         Renford Dills, NP 12/22/18 1023

## 2018-12-22 NOTE — Discharge Instructions (Addendum)
Take medication as prescribed. Rest. Drink plenty of fluids.   Monitor your blood pressure and monitor home medications as discussed.  Recheck your blood pressure in a few days.  Follow up with your primary care physician this week as needed. Return to Urgent care for new or worsening concerns.

## 2018-12-28 ENCOUNTER — Emergency Department
Admission: EM | Admit: 2018-12-28 | Discharge: 2018-12-28 | Disposition: A | Discharge status: Home / Self Care | Payer: No Typology Code available for payment source | Attending: Emergency Medicine | Admitting: Emergency Medicine

## 2018-12-28 ENCOUNTER — Other Ambulatory Visit: Payer: Self-pay

## 2018-12-28 DIAGNOSIS — Y9389 Activity, other specified: Secondary | ICD-10-CM | POA: Insufficient documentation

## 2018-12-28 DIAGNOSIS — W228XXA Striking against or struck by other objects, initial encounter: Secondary | ICD-10-CM | POA: Insufficient documentation

## 2018-12-28 DIAGNOSIS — S0592XA Unspecified injury of left eye and orbit, initial encounter: Secondary | ICD-10-CM | POA: Diagnosis present

## 2018-12-28 DIAGNOSIS — S0502XA Injury of conjunctiva and corneal abrasion without foreign body, left eye, initial encounter: Secondary | ICD-10-CM | POA: Diagnosis not present

## 2018-12-28 DIAGNOSIS — Y990 Civilian activity done for income or pay: Secondary | ICD-10-CM | POA: Diagnosis not present

## 2018-12-28 DIAGNOSIS — Y9289 Other specified places as the place of occurrence of the external cause: Secondary | ICD-10-CM | POA: Diagnosis not present

## 2018-12-28 DIAGNOSIS — Z79899 Other long term (current) drug therapy: Secondary | ICD-10-CM | POA: Diagnosis not present

## 2018-12-28 MED ORDER — FLUORESCEIN SODIUM 1 MG OP STRP
ORAL_STRIP | OPHTHALMIC | Status: AC
  Filled 2018-12-28: qty 1

## 2018-12-28 MED ORDER — FLUORESCEIN SODIUM 1 MG OP STRP
1.0000 | ORAL_STRIP | Freq: Once | OPHTHALMIC | Status: AC
  Administered 2018-12-28: 1 via OPHTHALMIC
  Filled 2018-12-28: qty 1

## 2018-12-28 MED ORDER — TETRACAINE HCL 0.5 % OP SOLN
2.0000 [drp] | Freq: Once | OPHTHALMIC | Status: AC
  Administered 2018-12-28: 2 [drp] via OPHTHALMIC
  Filled 2018-12-28: qty 4

## 2018-12-28 MED ORDER — POLYMYXIN B-TRIMETHOPRIM 10000-0.1 UNIT/ML-% OP SOLN: 2.0000 [drp] | Freq: Three times a day (TID) | OPHTHALMIC | 0 refills | Status: DC

## 2018-12-28 MED ORDER — KETOROLAC TROMETHAMINE 0.5 % OP SOLN: 1.0000 [drp] | Freq: Four times a day (QID) | OPHTHALMIC | 0 refills | Status: DC

## 2018-12-28 NOTE — ED Notes (Signed)
Susan Stevenson called back from [facility] and stated no drug testing is needed. The eye exam along with approval to be released for work is all they need.

## 2018-12-28 NOTE — ED Triage Notes (Signed)
Left eye injury with cardboard box PTA. Workers comp.

## 2018-12-28 NOTE — ED Provider Notes (Signed)
[facility] Emergency Department Provider Note  ____________________________________________  Time seen: Approximately 4:34 PM  I have reviewed the triage vital signs and the nursing notes.   HISTORY  Chief Complaint Eye Injury    HPI Susan Stevenson is a 38 y.o. female who presents the emergency department complaining of left eye pain after injury.  Patient was at work, breaking down cardboard boxes when she accidentally made contact with the left eye with 1 of the edges of the cardboard box.  Patient has had pain, excessive tearing since incident.  Patient denies any visual changes.  She denies any periorbital pain.  She reports a foreign body sensation in the middle and superior aspect of her eye.  Patient does not wear glasses or contacts.  No other injury or complaint at this time.    Past Medical History:  Diagnosis Date  . Anxiety     There are no active problems to display for this patient.   Past Surgical History:  Procedure Laterality Date  . CESAREAN SECTION N/A 05/30/2015   Procedure: CESAREAN SECTION ;  Surgeon: Vena AustriaAndreas Staebler, MD;  Location: [facility] ORS;  Service: Obstetrics;  Laterality: N/A;    Prior to Admission medications   Medication Sig Start Date End Date Taking? Authorizing Provider  chlorpheniramine-HYDROcodone (TUSSIONEX PENNKINETIC ER) 10-8 MG/5ML SUER Take 5 mLs by mouth at bedtime as needed for cough. do not drive or operate machinery while taking as can cause drowsiness. 12/22/18   Renford DillsMiller, Lindsey, NP  doxycycline (VIBRAMYCIN) 100 MG capsule Take 1 capsule (100 mg total) by mouth 2 (two) times daily. 12/22/18   Renford DillsMiller, Lindsey, NP  FLUoxetine (PROZAC) 20 MG capsule TAKE 1 CAPSULE BY MOUTH EVERY DAY 12/04/18   [provider]  ketorolac (ACULAR) 0.5 % ophthalmic solution Place 1 drop into the left eye 4 (four) times daily. 12/28/18   Colton Tassin, Delorise RoyalsJonathan D, PA-C  norethindrone (AYGESTIN) 5 MG tablet Take 1 tablet (5 mg  total) by mouth daily. 12/11/18   Vena AustriaStaebler, Andreas, MD  phentermine (ADIPEX-P) 37.5 MG tablet Take 1 tablet (37.5 mg total) by mouth daily before breakfast. 12/11/18   Vena AustriaStaebler, Andreas, MD  trimethoprim-polymyxin b (POLYTRIM) ophthalmic solution Place 2 drops into the left eye every 8 (eight) hours. 12/28/18   Josephyne Tarter, Delorise RoyalsJonathan D, PA-C    Allergies Penicillins and Percocet [oxycodone-acetaminophen]  No family history on file.  Social History Social History   Tobacco Use  . Smoking status: Never Smoker  . Smokeless tobacco: Never Used  Substance Use Topics  . Alcohol use: No  . Drug use: No     Review of Systems  Constitutional: No fever/chills Eyes: No visual changes.  Positive for left eye injury.  Foreign body sensation to the left eye.  Excessive tearing but no purulent drainage. ENT: No upper respiratory complaints. Cardiovascular: no chest pain. Respiratory: no cough. No SOB. Gastrointestinal: No abdominal pain.  No nausea, no vomiting.   Musculoskeletal: Negative for musculoskeletal pain. Skin: Negative for rash, abrasions, lacerations, ecchymosis. Neurological: Negative for headaches, focal weakness or numbness. 10-point ROS otherwise negative.  ____________________________________________   PHYSICAL EXAM:  VITAL SIGNS: ED Triage Vitals  Enc Vitals Group     BP 12/28/18 1602 (!) 156/89     Pulse Rate 12/28/18 1602 81     Resp 12/28/18 1602 18     Temp 12/28/18 1602 99.2 F (37.3 C)     Temp Source 12/28/18 1602 Oral     SpO2 12/28/18 1602 97 %  Weight 12/28/18 1603 219 lb 12.8 oz (99.7 kg)     Height 12/28/18 1603 5\' 5"  (1.651 m)     Head Circumference --      Peak Flow --      Pain Score 12/28/18 1603 4     Pain Loc --      Pain Edu? --      Excl. in GC? --      Constitutional: Alert and oriented. Well appearing and in no acute distress. Eyes: Conjunctivae are normal. PERRL. EOMI. funduscopic exam reveals no visible foreign body.  Vasculature  and optic disc is unremarkable to the left eye.  Fluorescein staining is applied after tetracaine for anesthesia.  Area of uptake is appreciated in the 12:00 and 6 o'clock position.  12 o'clock position is over the pupil, very minimal.  6:00 abrasion is approximately 2 mm in diameter.  No foreign body. Head: Atraumatic. Neck: No stridor.    Cardiovascular: Normal rate, regular rhythm. Normal S1 and S2.  Good peripheral circulation. Respiratory: Normal respiratory effort without tachypnea or retractions. Lungs CTAB. Good air entry to the bases with no decreased or absent breath sounds. Musculoskeletal: Full range of motion to all extremities. No gross deformities appreciated. Neurologic:  Normal speech and language. No gross focal neurologic deficits are appreciated.  Skin:  Skin is warm, dry and intact. No rash noted. Psychiatric: Mood and affect are normal. Speech and behavior are normal. Patient exhibits appropriate insight and judgement.   ____________________________________________   LABS (all labs ordered are listed, but only abnormal results are displayed)  Labs Reviewed - No data to display ____________________________________________  EKG   ____________________________________________  RADIOLOGY   No results found.  ____________________________________________    PROCEDURES  Procedure(s) performed:    Procedures    Medications  fluorescein ophthalmic strip 1 strip (has no administration in time range)  tetracaine (PONTOCAINE) 0.5 % ophthalmic solution 2 drop (has no administration in time range)  fluorescein 1 MG ophthalmic strip (has no administration in time range)     ____________________________________________   INITIAL IMPRESSION / ASSESSMENT AND PLAN / ED COURSE  Pertinent labs & imaging results that were available during my care of the patient were reviewed by me and considered in my medical decision making (see chart for details).  Review of  the [place] CSRS was performed in accordance of the [facility] prior to dispensing any controlled drugs.      Patient's diagnosis is consistent with corneal abrasion.  Patient presents the emergency department complaining of left eye pain after injury at work.  On exam, patient has findings consistent with corneal abrasion which matches patient's symptoms.  No evidence of retained foreign body.  No evidence of intraocular/globe trauma.  Patient will be placed on antibiotic eyedrops and Acular for symptom relief.  Follow-up with ophthalmologist if needed..Patient is given ED precautions to return to the ED for any worsening or new symptoms.     ____________________________________________  FINAL CLINICAL IMPRESSION(S) / ED DIAGNOSES  Final diagnoses:  Abrasion of left cornea, initial encounter      NEW MEDICATIONS STARTED DURING THIS VISIT:  ED Discharge Orders         Ordered    ketorolac (ACULAR) 0.5 % ophthalmic solution  4 times daily     12/28/18 1653    trimethoprim-polymyxin b (POLYTRIM) ophthalmic solution  Every 8 hours     12/28/18 1653              This chart was  dictated using voice recognition software/Dragon. Despite best efforts to proofread, errors can occur which can change the meaning. Any change was purely unintentional.    Racheal Patches, PA-C 12/28/18 1654    Pershing Proud Myra Rude, MD 12/28/18 [phone]

## 2019-01-03 ENCOUNTER — Other Ambulatory Visit: Payer: Self-pay | Admitting: Family Medicine

## 2019-01-08 ENCOUNTER — Encounter: Payer: Self-pay | Admitting: Obstetrics and Gynecology

## 2019-01-08 ENCOUNTER — Ambulatory Visit (INDEPENDENT_AMBULATORY_CARE_PROVIDER_SITE_OTHER): Payer: 59 | Admitting: Obstetrics and Gynecology

## 2019-01-08 VITALS — BP 142/102 | HR 77 | Wt 223.0 lb

## 2019-01-08 DIAGNOSIS — Z3041 Encounter for surveillance of contraceptive pills: Secondary | ICD-10-CM

## 2019-01-08 DIAGNOSIS — Z6837 Body mass index (BMI) 37.0-37.9, adult: Secondary | ICD-10-CM | POA: Diagnosis not present

## 2019-01-08 DIAGNOSIS — E669 Obesity, unspecified: Secondary | ICD-10-CM | POA: Diagnosis not present

## 2019-01-08 MED ORDER — DROSPIRENONE 4 MG PO TABS: 1.0000 | ORAL_TABLET | ORAL | 3 refills | Status: AC

## 2019-01-08 NOTE — Progress Notes (Signed)
Gynecology Office Visit  Chief Complaint:  Chief Complaint  Patient presents with  . Medication follow up    History of Present Illness: Patientis a 38 y.o. 853P2001 female, who presents for the evaluation of the desire to lose weight. She has lost 1 pounds 1 months. The patient states she only took her phentermine for 3 days.  She was diagnosed with the influenza and discontinued during that time has not yet restarted.  We had previously talked about switching from norethindrone to Mirena.  She is hesitant about Mirena. 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     Review of Systems: 10 point review of systems negative unless otherwise noted in HPI  Past Medical History:  Past Medical History:  Diagnosis Date  . Anxiety     Past Surgical History:  Past Surgical History:  Procedure Laterality Date  . CESAREAN SECTION N/A 05/30/2015   Procedure: CESAREAN SECTION ;  Surgeon: Vena AustriaAndreas Drayke Grabel, MD;  Location: [facility] ORS;  Service: Obstetrics;  Laterality: N/A;    Gynecologic History: No LMP recorded.  Obstetric History: G3P2001  Family History:  History reviewed. No pertinent family history.  Social History:  Social History   Socioeconomic History  . Marital status: Married    Spouse name: Not on file  . Number of children: Not on file  . Years of education: Not on file  . Highest education level: Not on file  Occupational History  . Not on file  Social Needs  . Financial resource strain: Not on file  . Food insecurity:    Worry: Not on file    Inability: Not on file  . Transportation needs:    Medical: Not on file    Non-medical: Not on file  Tobacco Use  . Smoking status: Never Smoker  . Smokeless tobacco: Never Used  Substance and Sexual Activity  . Alcohol use: No  . Drug use: No  . Sexual activity: Yes    Birth control/protection: Pill  Lifestyle  . Physical activity:   Days per week: Not on file    Minutes per session: Not on file  . Stress: Not on file  Relationships  . Social connections:    Talks on phone: Not on file    Gets together: Not on file    Attends religious service: Not on file    Active member of club or organization: Not on file    Attends meetings of clubs or organizations: Not on file    Relationship status: Not on file  . Intimate partner violence:    Fear of current or ex partner: Not on file    Emotionally abused: Not on file    Physically abused: Not on file    Forced sexual activity: Not on file  Other Topics Concern  . Not on file  Social History Narrative  . Not on file    Allergies:  Allergies  Allergen Reactions  . Penicillins Anaphylaxis  . Percocet [Oxycodone-Acetaminophen] Other (See Comments)    migraine    Medications: Prior to Admission medications   Medication Sig Start Date End Date Taking? Authorizing Provider  FLUoxetine (PROZAC) 20 MG capsule TAKE 1 CAPSULE BY MOUTH EVERY DAY 12/04/18   [provider]  ketorolac (ACULAR) 0.5 % ophthalmic solution Place 1 drop into the left eye 4 (four) times daily. 12/28/18   Cuthriell, Delorise RoyalsJonathan D, PA-C  norethindrone (AYGESTIN)  5 MG tablet Take 1 tablet (5 mg total) by mouth daily. 12/11/18   [name], MD  phentermine (ADIPEX-P) 37.5 MG tablet Take 1 tablet (37.5 mg total) by mouth daily before breakfast. 12/11/18   [name], MD  trimethoprim-polymyxin b (POLYTRIM) ophthalmic solution Place 2 drops into the left eye every 8 (eight) hours. 12/28/18   Cuthriell, Delorise Royals, PA-C    Physical Exam Blood pressure (!) 142/102, pulse 77, weight 223 lb (101.2 kg). Body mass index is 37.11 kg/m.  Wt Readings from Last 3 Encounters:  01/08/19 223 lb (101.2 kg)  12/28/18 219 lb 12.8 oz (99.7 kg)  12/22/18 220 lb (99.8 kg)    General: NAD HEENT: normocephalic, anicteric Thyroid: no enlargement Pulmonary: no increased work of  breathing Neurologic: Grossly intact Psychiatric: mood appropriate, affect full  Assessment: 38 y.o. G3P2001 medical weight loss follow up, contraception management  Plan: Problem List Items Addressed This Visit    None    Visit Diagnoses    Class 2 obesity without serious comorbidity with body mass index (BMI) of 37.0 to 37.9 in adult, unspecified obesity type    -  Primary   Encounter for surveillance of contraceptive pills          1) 1500 Calorie ADA Diet  2) Patient education given regarding appropriate lifestyle changes for weight loss including: regular physical activity, healthy coping strategies, caloric restriction and healthy eating patterns.  3) Patient will be started on weight loss medication. The risks and benefits and side effects of medication, such as Adipex (Phenteramine) ,  Tenuate (Diethylproprion), Belviq (lorcarsin), Contrave (buproprion/naltrexone), Qsymia (phentermine/topiramate), and Saxenda (liraglutide) is discussed. The pros and cons of suppressing appetite and boosting metabolism is discussed. Risks of tolerence and addiction is discussed for selected agents discussed. Use of medicine will ne short term, such as 3-4 months at a time followed by a period of time off of the medicine to avoid these risks and side effects for Adipex, Qsymia, and Tenuate discussed. Pt to call with any negative side effects and agrees to keep follow up appts. - start phentermine  4) Patient to take medication, with the benefits of appetite suppression and metabolism boost d/w pt, along with the side effects and risk factors of long term use that will be avoided with our use of short bursts of therapy. Rx provided.    5) Switch from norethindrone to [employer].  Given 2 months of samples.  She is not a candidate for oral estrogen given diagnosed of HTN with worsening on OCP in the past.    6)  A total of 15 minutes were spent in face-to-face contact with the patient during this encounter  with over half of that time devoted to counseling and coordination of care.  7) Return in about 4 weeks (around 02/05/2019) for ROB.   [name], MD, [facility], [facility] 01/08/2019, 9:01 AM

## 2019-01-24 ENCOUNTER — Other Ambulatory Visit: Payer: Self-pay

## 2019-01-24 NOTE — Telephone Encounter (Signed)
Last refill 11/2018, advise

## 2019-01-25 ENCOUNTER — Other Ambulatory Visit: Payer: Self-pay | Admitting: Obstetrics and Gynecology

## 2019-01-25 MED ORDER — PHENTERMINE HCL 37.5 MG PO TABS: 37.5000 mg | ORAL_TABLET | Freq: Every day | ORAL | 0 refills | Status: DC

## 2019-01-30 DIAGNOSIS — Z0000 Encounter for general adult medical examination without abnormal findings: Secondary | ICD-10-CM | POA: Diagnosis not present

## 2019-01-30 DIAGNOSIS — I10 Essential (primary) hypertension: Secondary | ICD-10-CM | POA: Diagnosis not present

## 2019-01-30 DIAGNOSIS — E7800 Pure hypercholesterolemia, unspecified: Secondary | ICD-10-CM | POA: Diagnosis not present

## 2019-02-05 ENCOUNTER — Encounter: Payer: Self-pay | Admitting: Obstetrics and Gynecology

## 2019-02-05 ENCOUNTER — Ambulatory Visit (INDEPENDENT_AMBULATORY_CARE_PROVIDER_SITE_OTHER): Payer: 59 | Admitting: Obstetrics and Gynecology

## 2019-02-05 VITALS — BP 112/62 | HR 77 | Wt 212.0 lb

## 2019-02-05 DIAGNOSIS — Z6835 Body mass index (BMI) 35.0-35.9, adult: Secondary | ICD-10-CM | POA: Diagnosis not present

## 2019-02-05 DIAGNOSIS — E669 Obesity, unspecified: Secondary | ICD-10-CM | POA: Diagnosis not present

## 2019-02-05 MED ORDER — DROSPIRENONE 4 MG PO TABS: 1.0000 | ORAL_TABLET | Freq: Every day | ORAL | 1 refills | Status: DC

## 2019-02-05 NOTE — Progress Notes (Signed)
Gynecology Office Visit  Chief Complaint:  Chief Complaint  Patient presents with  . Weight Check    History of Present Illness: Patientis a 38 y.o. G9P2001 female, who presents for the evaluation of the desire to lose weight. She has lost 11 pounds 1 months. The patient states the following symptoms since starting her weight loss therapy: appetite suppression, energy, and weight loss.  The patient also reports no other ill effects. The patient specifically denies heart palpitations, anxiety, and insomnia.   Did not take some this month after catching influenza.   Review of Systems: 10 point review of systems negative unless otherwise noted in HPI  Past Medical History:  Past Medical History:  Diagnosis Date  . Anxiety     Past Surgical History:  Past Surgical History:  Procedure Laterality Date  . CESAREAN SECTION N/A 05/30/2015   Procedure: CESAREAN SECTION ;  Surgeon: [name], MD;  Location: [facility] ORS;  Service: Obstetrics;  Laterality: N/A;    Gynecologic History: No LMP recorded.  Obstetric History: G3P2001  Family History:  History reviewed. No pertinent family history.  Social History:  Social History   Socioeconomic History  . Marital status: Married    Spouse name: Not on file  . Number of children: Not on file  . Years of education: Not on file  . Highest education level: Not on file  Occupational History  . Not on file  Social Needs  . Financial resource strain: Not on file  . Food insecurity:    Worry: Not on file    Inability: Not on file  . Transportation needs:    Medical: Not on file    Non-medical: Not on file  Tobacco Use  . Smoking status: Never Smoker  . Smokeless tobacco: Never Used  Substance and Sexual Activity  . Alcohol use: No  . Drug use: No  . Sexual activity: Yes    Birth control/protection: Pill  Lifestyle  . Physical activity:    Days per week: Not on file    Minutes per session: Not on file  . Stress: Not  on file  Relationships  . Social connections:    Talks on phone: Not on file    Gets together: Not on file    Attends religious service: Not on file    Active member of club or organization: Not on file    Attends meetings of clubs or organizations: Not on file    Relationship status: Not on file  . Intimate partner violence:    Fear of current or ex partner: Not on file    Emotionally abused: Not on file    Physically abused: Not on file    Forced sexual activity: Not on file  Other Topics Concern  . Not on file  Social History Narrative  . Not on file    Allergies:  Allergies  Allergen Reactions  . Penicillins Anaphylaxis  . Percocet [Oxycodone-Acetaminophen] Other (See Comments)    migraine    Medications: Prior to Admission medications   Medication Sig Start Date End Date Taking? Authorizing Provider  bisoprolol-hydrochlorothiazide (ZIAC) 5-6.25 MG tablet Take by mouth. 01/30/19 01/30/20 Yes [provider]  clobetasol ointment (TEMOVATE) 0.05 %  12/25/18   [provider]  FLUoxetine (PROZAC) 20 MG capsule TAKE 1 CAPSULE BY MOUTH EVERY DAY 12/04/18   [provider]  norethindrone (AYGESTIN) 5 MG tablet Take 1 tablet (5 mg total) by mouth daily. 12/11/18   [name],  MD  phentermine (ADIPEX-P) 37.5 MG tablet Take 1 tablet (37.5 mg total) by mouth daily before breakfast. 01/25/19   [name], MD    Physical Exam Blood pressure 112/62, pulse 77, weight 212 lb (96.2 kg). Wt Readings from Last 3 Encounters:  02/05/19 212 lb (96.2 kg)  01/08/19 223 lb (101.2 kg)  12/28/18 219 lb 12.8 oz (99.7 kg)  Body mass index is 35.28 kg/m.   General: NAD HEENT: normocephalic, anicteric Thyroid: no enlargement Pulmonary: no increased work of breathing Neurologic: Grossly intact Psychiatric: mood appropriate, affect full  Assessment: 38 y.o. G3P2001 No problem-specific Assessment & Plan notes found for this encounter.   Plan: Problem List  Items Addressed This Visit    None      1) 1500 Calorie ADA Diet  2) Patient education given regarding appropriate lifestyle changes for weight loss including: regular physical activity, healthy coping strategies, caloric restriction and healthy eating patterns.  3) Patient will be started on weight loss medication. The risks and benefits and side effects of medication, such as Adipex (Phenteramine) ,  Tenuate (Diethylproprion), Belviq (lorcarsin), Contrave (buproprion/naltrexone), Qsymia (phentermine/topiramate), and Saxenda (liraglutide) is discussed. The pros and cons of suppressing appetite and boosting metabolism is discussed. Risks of tolerence and addiction is discussed for selected agents discussed. Use of medicine will ne short term, such as 3-4 months at a time followed by a period of time off of the medicine to avoid these risks and side effects for Adipex, Qsymia, and Tenuate discussed. Pt to call with any negative side effects and agrees to keep follow up appts.  4) Patient to take medication, with the benefits of appetite suppression and metabolism boost d/w pt, along with the side effects and risk factors of long term use that will be avoided with our use of short bursts of therapy. Rx provided.    5) Is starting Slynd this month, discontinuing norethindrone  6)  15 minutes face-to-face; with counseling/coordination of care > 50 percent of visit related to obesity and ongoing management/treatment     [name], MD, [facility], [facility] 02/05/2019, 8:21 AM

## 2019-02-26 ENCOUNTER — Other Ambulatory Visit: Payer: Self-pay | Admitting: Obstetrics and Gynecology

## 2019-02-26 NOTE — Telephone Encounter (Signed)
Susan Stevenson please call patient and put on schedule for telephone visit at her convenience.Thanks

## 2019-02-26 NOTE — Telephone Encounter (Signed)
Advise

## 2019-02-26 NOTE — Telephone Encounter (Signed)
Needs telephone visit for weight etc

## 2019-02-27 NOTE — Telephone Encounter (Signed)
Patient is schedule 03/06/19

## 2019-03-06 ENCOUNTER — Ambulatory Visit: Payer: 59 | Admitting: Obstetrics and Gynecology

## 2019-03-07 ENCOUNTER — Other Ambulatory Visit: Payer: Self-pay

## 2019-03-07 ENCOUNTER — Ambulatory Visit (INDEPENDENT_AMBULATORY_CARE_PROVIDER_SITE_OTHER): Payer: 59 | Admitting: Obstetrics and Gynecology

## 2019-03-07 ENCOUNTER — Encounter: Payer: Self-pay | Admitting: Obstetrics and Gynecology

## 2019-03-07 VITALS — Ht 65.0 in | Wt 210.0 lb

## 2019-03-07 DIAGNOSIS — E669 Obesity, unspecified: Secondary | ICD-10-CM

## 2019-03-07 DIAGNOSIS — Z6834 Body mass index (BMI) 34.0-34.9, adult: Secondary | ICD-10-CM | POA: Diagnosis not present

## 2019-03-07 MED ORDER — BISOPROLOL-HYDROCHLOROTHIAZIDE 5-6.25 MG PO TABS: 1.0000 | ORAL_TABLET | Freq: Every day | ORAL | 2 refills | Status: DC

## 2019-03-07 MED ORDER — PHENTERMINE HCL 37.5 MG PO TABS: 37.5000 mg | ORAL_TABLET | Freq: Every day | ORAL | 0 refills | Status: DC

## 2019-03-07 NOTE — Progress Notes (Addendum)
I connected with Susan Stevenson on 03/07/19 at  2:30 PM EDT by telephone and verified that I am speaking with the correct person using two identifiers.   I discussed the limitations, risks, security and privacy concerns of performing an evaluation and management service by telephone and the availability of in person appointments. I also discussed with the patient that there may be a patient responsible charge related to this service. The patient expressed understanding and agreed to proceed.  The patient was at home I spoke with the patient from my workstation phone The names of people involved in this encounter were: Susan Stevenson , and Susan Stevenson    Gynecology Office Visit  Chief Complaint:  Chief Complaint  Patient presents with  . Follow-up    Weight check    History of Present Illness: Patientis a 38 y.o. G74P2001 female, who presents for the evaluation of the desire to lose weight. She has lost 2 pounds 1 months, 2 month total weight loss 13lbs. The patient states the following symptoms since starting her weight loss therapy: appetite suppression, energy, and weight loss.  The patient also reports no other ill effects. The patient specifically denies heart palpitations, anxiety, and insomnia.   BP was slightly elevated at time of follow up with PCP 138/100 was started on anti-hypertensive.     Review of Systems: 10 point review of systems negative unless otherwise noted in HPI  Past Medical History:  Past Medical History:  Diagnosis Date  . Anxiety     Past Surgical History:  Past Surgical History:  Procedure Laterality Date  . CESAREAN SECTION N/A 05/30/2015   Procedure: CESAREAN SECTION ;  Surgeon: Susan Austria, MD;  Location: [facility] ORS;  Service: Obstetrics;  Laterality: N/A;    Gynecologic History: No LMP recorded.  Obstetric History: G3P2001  Family History:  No family history on file.  Social History:  Social History    Socioeconomic History  . Marital status: Married    Spouse name: Not on file  . Number of children: Not on file  . Years of education: Not on file  . Highest education level: Not on file  Occupational History  . Not on file  Social Needs  . Financial resource strain: Not on file  . Food insecurity:    Worry: Not on file    Inability: Not on file  . Transportation needs:    Medical: Not on file    Non-medical: Not on file  Tobacco Use  . Smoking status: Never Smoker  . Smokeless tobacco: Never Used  Substance and Sexual Activity  . Alcohol use: No  . Drug use: No  . Sexual activity: Yes    Birth control/protection: Pill  Lifestyle  . Physical activity:    Days per week: Not on file    Minutes per session: Not on file  . Stress: Not on file  Relationships  . Social connections:    Talks on phone: Not on file    Gets together: Not on file    Attends religious service: Not on file    Active member of club or organization: Not on file    Attends meetings of clubs or organizations: Not on file    Relationship status: Not on file  . Intimate partner violence:    Fear of current or ex partner: Not on file    Emotionally abused: Not on file    Physically abused: Not on file    Forced sexual  activity: Not on file  Other Topics Concern  . Not on file  Social History Narrative  . Not on file    Allergies:  Allergies  Allergen Reactions  . Penicillins Anaphylaxis  . Percocet [Oxycodone-Acetaminophen] Other (See Comments)    migraine    Medications: Prior to Admission medications   Medication Sig Start Date End Date Taking? Authorizing Provider  bisoprolol-hydrochlorothiazide (ZIAC) 5-6.25 MG tablet Take by mouth. 01/30/19 01/30/20 Yes [provider]  clobetasol ointment (TEMOVATE) 0.05 %  12/25/18  Yes [provider]  Drospirenone (SLYND) 4 MG TABS Take 1 tablet by mouth daily. 02/05/19  Yes Susan AustriaStaebler, Oather Muilenburg, MD  FLUoxetine (PROZAC) 20 MG capsule TAKE 1  CAPSULE BY MOUTH EVERY DAY 12/04/18  Yes [provider]  phentermine (ADIPEX-P) 37.5 MG tablet Take 1 tablet (37.5 mg total) by mouth daily before breakfast. 01/25/19  Yes Susan AustriaStaebler, Shera Laubach, MD    Physical Exam Height 5\' 5"  (1.651 m), weight 210 lb (95.3 kg). Wt Readings from Last 3 Encounters:  03/07/19 210 lb (95.3 kg)  02/05/19 212 lb (96.2 kg)  01/08/19 223 lb (101.2 kg)  Body mass index is 34.95 kg/m.  No physical exam this was a phone visit to promote social distancing given current COVID-19 pandeminc  Assessment: 38 y.o. G3P2001 medical weight loss follow up Plan: Problem List Items Addressed This Visit    None    Visit Diagnoses    Class 1 obesity without serious comorbidity with body mass index (BMI) of 34.0 to 34.9 in adult, unspecified obesity type    -  Primary   Relevant Medications   phentermine (ADIPEX-P) 37.5 MG tablet      1) 1500 Calorie ADA Diet  3) Patient to take medication, with the benefits of appetite suppression and metabolism boost d/w pt, along with the side effects and risk factors of long term use that will be avoided with our use of short bursts of therapy. Rx provided.    4) 9 minutes of telephone time  5) Return in about 4 weeks (around 04/04/2019).     Susan AustriaAndreas Bobbiejo Ishikawa, MD, Ever[facility], [facility] 03/07/2019, 3:07 PM

## 2019-05-02 ENCOUNTER — Other Ambulatory Visit: Payer: Self-pay | Admitting: Obstetrics and Gynecology

## 2019-05-02 MED ORDER — NALTREXONE-BUPROPION HCL ER 8-90 MG PO TB12: ORAL_TABLET | ORAL | 0 refills | Status: DC

## 2019-08-13 ENCOUNTER — Other Ambulatory Visit: Payer: Self-pay | Admitting: Obstetrics and Gynecology

## 2019-09-04 NOTE — Telephone Encounter (Signed)
I probably need to see her for a visit if we are changing up antidepressant too given that phentermine can worsen anxiety

## 2019-11-12 ENCOUNTER — Other Ambulatory Visit: Payer: Self-pay | Admitting: Obstetrics and Gynecology

## 2019-11-13 NOTE — Telephone Encounter (Signed)
Annual appointment is due in January. Last appointment for weight check was 02/2019

## 2019-11-27 ENCOUNTER — Other Ambulatory Visit: Payer: Self-pay | Admitting: Physical Medicine and Rehabilitation

## 2019-11-27 DIAGNOSIS — M5416 Radiculopathy, lumbar region: Secondary | ICD-10-CM

## 2019-12-24 ENCOUNTER — Other Ambulatory Visit: Payer: 59

## 2020-01-13 ENCOUNTER — Other Ambulatory Visit: Payer: Self-pay | Admitting: Obstetrics and Gynecology

## 2020-01-15 ENCOUNTER — Other Ambulatory Visit: Payer: 59

## 2020-01-18 ENCOUNTER — Ambulatory Visit
Admission: RE | Admit: 2020-01-18 | Discharge: 2020-01-18 | Disposition: A | Discharge status: Home / Self Care | Payer: 59 | Source: Ambulatory Visit | Attending: Physical Medicine and Rehabilitation | Admitting: Physical Medicine and Rehabilitation

## 2020-01-18 ENCOUNTER — Other Ambulatory Visit: Payer: Self-pay

## 2020-01-18 DIAGNOSIS — M5416 Radiculopathy, lumbar region: Secondary | ICD-10-CM

## 2020-02-14 ENCOUNTER — Other Ambulatory Visit: Payer: Self-pay | Admitting: Obstetrics and Gynecology

## 2020-04-04 ENCOUNTER — Other Ambulatory Visit: Payer: Self-pay | Admitting: Obstetrics and Gynecology

## 2020-04-17 ENCOUNTER — Encounter: Payer: Self-pay | Admitting: Obstetrics and Gynecology

## 2020-04-17 ENCOUNTER — Other Ambulatory Visit: Payer: Self-pay

## 2020-04-17 ENCOUNTER — Ambulatory Visit (INDEPENDENT_AMBULATORY_CARE_PROVIDER_SITE_OTHER): Payer: 59 | Admitting: Obstetrics and Gynecology

## 2020-04-17 VITALS — BP 146/88 | HR 106 | Ht 65.0 in | Wt 245.0 lb

## 2020-04-17 DIAGNOSIS — Z6841 Body Mass Index (BMI) 40.0 and over, adult: Secondary | ICD-10-CM | POA: Diagnosis not present

## 2020-04-17 DIAGNOSIS — Z3041 Encounter for surveillance of contraceptive pills: Secondary | ICD-10-CM

## 2020-04-17 DIAGNOSIS — Z01419 Encounter for gynecological examination (general) (routine) without abnormal findings: Secondary | ICD-10-CM | POA: Diagnosis not present

## 2020-04-17 DIAGNOSIS — Z124 Encounter for screening for malignant neoplasm of cervix: Secondary | ICD-10-CM | POA: Diagnosis not present

## 2020-04-17 DIAGNOSIS — Z1239 Encounter for other screening for malignant neoplasm of breast: Secondary | ICD-10-CM

## 2020-04-17 MED ORDER — PHENTERMINE HCL 37.5 MG PO TABS: 37.5000 mg | ORAL_TABLET | Freq: Every day | ORAL | 0 refills | Status: DC

## 2020-04-17 MED ORDER — SLYND 4 MG PO TABS: 1.0000 | ORAL_TABLET | Freq: Every day | ORAL | 3 refills | Status: DC

## 2020-04-17 NOTE — Progress Notes (Signed)
Gynecology Annual Exam   PCP: [facility], [facility]  Chief Complaint:  Chief Complaint  Patient presents with  . Gynecologic Exam    Discuss getting back on weight loss medication    History of Present Illness: Patient is a 39 y.o. G3P2001 presents for annual exam. The patient has no complaints today.   LMP: No LMP recorded. (Menstrual status: Oral contraceptives). Amenorrhea on slynd, occasional light spotting on end of pill pack.  The patient is sexually active. She currently uses oral progesterone-only contraceptive for contraception. She denies dyspareunia.  The patient does perform self breast exams.  There is no notable family history of breast or ovarian cancer in her family.  The patient wears seatbelts: yes.   The patient has regular exercise: not asked.    The patient denies current symptoms of depression.     Patientis a 39 y.o. G50P2001 female, who presents for the evaluation of weight gain. She has gained 36 pounds primarily over 12 months. The patient states the following issues have contributed to her weight problem: sedentary lifestyle during quarantine.  The patient has no additional symptoms. The patient specifically denies memory loss, muscle weakness, excessive thirst, and polyuria. Weight related co-morbidities include HTN. The patient's past medical history is notable for HTN. She has tried phentermine interventions in the past with good success, and stable BP's.    Review of Systems: Review of Systems  Constitutional: Negative for chills and fever.  HENT: Negative for congestion.   Respiratory: Negative for cough and shortness of breath.   Cardiovascular: Negative for chest pain and palpitations.  Gastrointestinal: Negative for abdominal pain, constipation, diarrhea, heartburn, nausea and vomiting.  Genitourinary: Negative for dysuria, frequency and urgency.  Skin: Negative for itching and rash.  Neurological: Negative for dizziness and headaches.    Endo/Heme/Allergies: Negative for polydipsia.  Psychiatric/Behavioral: Negative for depression.    Past Medical History:  There are no problems to display for this patient.   Past Surgical History:  Past Surgical History:  Procedure Laterality Date  . CESAREAN SECTION N/A 05/30/2015   Procedure: CESAREAN SECTION ;  Surgeon: [name], MD;  Location: [facility] ORS;  Service: Obstetrics;  Laterality: N/A;    Gynecologic History:  No LMP recorded. (Menstrual status: Oral contraceptives). Contraception: oral progesterone-only contraceptive Last Pap: Results were: 12/11/2018 NIL and HR HPV+  07/29/2015 NIL and HR HPV negative     Obstetric History: G3P2001  Family History:  History reviewed. No pertinent family history.  Social History:  Social History   Socioeconomic History  . Marital status: Married    Spouse name: Not on file  . Number of children: Not on file  . Years of education: Not on file  . Highest education level: Not on file  Occupational History  . Not on file  Tobacco Use  . Smoking status: Never Smoker  . Smokeless tobacco: Never Used  Substance and Sexual Activity  . Alcohol use: No  . Drug use: No  . Sexual activity: Yes    Birth control/protection: Pill  Other Topics Concern  . Not on file  Social History Narrative  . Not on file   Social Determinants of Health   Financial Resource Strain:   . Difficulty of Paying Living Expenses:   Food Insecurity:   . Worried About Programme researcher, broadcasting/film/video in the Last Year:   . Barista in the Last Year:   Transportation Needs:   . Freight forwarder (Medical):   Marland Kitchen  Lack of Transportation (Non-Medical):   Physical Activity:   . Days of Exercise per Week:   . Minutes of Exercise per Session:   Stress:   . Feeling of Stress :   Social Connections:   . Frequency of Communication with Friends and Family:   . Frequency of Social Gatherings with Friends and Family:   . Attends Religious Services:    . Active Member of Clubs or Organizations:   . Attends Banker Meetings:   Marland Kitchen Marital Status:   Intimate Partner Violence:   . Fear of Current or Ex-Partner:   . Emotionally Abused:   Marland Kitchen Physically Abused:   . Sexually Abused:     Allergies:  Allergies  Allergen Reactions  . Penicillins Anaphylaxis  . Percocet [Oxycodone-Acetaminophen] Other (See Comments)    migraine    Medications: Prior to Admission medications   Medication Sig Start Date End Date Taking? Authorizing Provider  clobetasol ointment (TEMOVATE) 0.05 %  12/25/18  Yes [provider]  FLUoxetine (PROZAC) 20 MG capsule TAKE 1 CAPSULE BY MOUTH EVERY DAY 12/04/18  Yes [provider]  gabapentin (NEURONTIN) 300 MG capsule Take 300 mg by mouth at bedtime. 02/29/20  Yes [provider]  SLYND 4 MG TABS TAKE 1 TABLET BY MOUTH TO POST ANESTHESIA CARE UNIT FOR 1 DOSE 04/04/20  Yes [name], MD    Physical Exam Vitals: Blood pressure (!) 146/88, pulse (!) 106, height 5\' 5"  (1.651 m), weight 245 lb (111.1 kg). Body mass index is 40.77 kg/m.   General: NAD HEENT: normocephalic, anicteric Thyroid: no enlargement, no palpable nodules Pulmonary: No increased work of breathing, CTAB Cardiovascular: RRR, distal pulses 2+ Breast: Breast symmetrical, no tenderness, no palpable nodules or masses, no skin or nipple retraction present, no nipple discharge.  No axillary or supraclavicular lymphadenopathy. Abdomen: NABS, soft, non-tender, non-distended.  Umbilicus without lesions.  No hepatomegaly, splenomegaly or masses palpable. No evidence of hernia  Genitourinary:  External: Normal external female genitalia.  Normal urethral meatus, normal Bartholin's and Skene's glands.    Vagina: Normal vaginal mucosa, no evidence of prolapse.    Cervix: Grossly normal in appearance, no bleeding  Uterus: Non-enlarged, mobile, normal contour.  No CMT  Adnexa: ovaries non-enlarged, no adnexal  masses  Rectal: deferred  Lymphatic: no evidence of inguinal lymphadenopathy Extremities: no edema, erythema, or tenderness Neurologic: Grossly intact Psychiatric: mood appropriate, affect full  Female chaperone present for pelvic and breast  portions of the physical exam    Assessment: 39 y.o. G3P2001 routine annual exam  Plan: Problem List Items Addressed This Visit    None    Visit Diagnoses    Encounter for gynecological examination without abnormal finding    -  Primary   Screening for malignant neoplasm of cervix       Relevant Orders   Pap IG and HPV (high risk) DNA detection   Breast screening       Class 3 severe obesity with serious comorbidity and body mass index (BMI) of 40.0 to 44.9 in adult, unspecified obesity type (HCC)       Relevant Medications   phentermine (ADIPEX-P) 37.5 MG tablet   Encounter for surveillance of contraceptive pills         Annual 1) STI screening  was notoffered and therefore not obtained  2)  ASCCP guidelines and rational discussed.  Patient opts for yearly screening interval  3) Contraception - the patient is currently using  oral progesterone-only contraceptive.  She  is happy with her current form of contraception and plans to continue  4) Routine healthcare maintenance including cholesterol, diabetes screening discussed managed by PCP  5) Return in about 4 weeks (around 05/15/2020) for medication follow up (phone, video, or in office).    Obesity  1) 1500 Calorie ADA Diet  2) Patient education given regarding appropriate lifestyle changes for weight loss including: regular physical activity, healthy coping strategies, caloric restriction and healthy eating patterns.  3) Patient will be started on weight loss medication. The risks and benefits and side effects of medication, such as Adipex (Phenteramine) ,  Tenuate (Diethylproprion), Belviq (lorcarsin), Contrave (buproprion/naltrexone), Qsymia (phentermine/topiramate), and Saxenda  (liraglutide) is discussed. The pros and cons of suppressing appetite and boosting metabolism is discussed. Risks of tolerence and addiction is discussed for selected agents discussed. Use of medicine will ne short term, such as 3-4 months at a time followed by a period of time off of the medicine to avoid these risks and side effects for Adipex, Qsymia, and Tenuate discussed. Pt to call with any negative side effects and agrees to keep follow up appts.  4) Comorbidity Screening - hypothyroidism screening, diabetes, and hyperlipidemia screening offered  5) Encouraged weekly weight monitorig to track progress and sample 1 week food diary  6) Contraception - discussed that all weight loss drugs fall in to pregnancy category X, patient currently has reliable contraception in the form of progestin only OCP  7) 15 minutes face-to-face; counseling/coordination of care > 50 percent of visit  8) Follow up in 4 weeks to assess response    Malachy Mood, MD, [place], [place] 04/17/2020, 9:15 AM

## 2020-04-23 LAB — IGP, APTIMA HPV
HPV Aptima: NEGATIVE
PAP Smear Comment: 0

## 2020-04-30 ENCOUNTER — Telehealth: Payer: Self-pay | Admitting: Obstetrics and Gynecology

## 2020-04-30 NOTE — Telephone Encounter (Signed)
Patient is schedule for 05/26/20 at 8:30 for her colposcopy. Patient reports she may not have child care and may have to bring her son. Please advise

## 2020-04-30 NOTE — Telephone Encounter (Signed)
Is ok.

## 2020-04-30 NOTE — Telephone Encounter (Signed)
-----   Message from [name], MD sent at 04/29/2020  3:08 PM EDT ----- Regarding: colposcopy Needs colposcopy in the next 2-6 weeks

## 2020-05-15 ENCOUNTER — Telehealth: Payer: 59 | Admitting: Obstetrics and Gynecology

## 2020-05-25 ENCOUNTER — Other Ambulatory Visit: Payer: Self-pay

## 2020-05-26 ENCOUNTER — Ambulatory Visit: Payer: 59 | Admitting: Obstetrics and Gynecology

## 2020-05-27 ENCOUNTER — Other Ambulatory Visit: Payer: Self-pay

## 2020-05-27 ENCOUNTER — Ambulatory Visit (INDEPENDENT_AMBULATORY_CARE_PROVIDER_SITE_OTHER): Payer: 59 | Admitting: Obstetrics and Gynecology

## 2020-05-27 ENCOUNTER — Other Ambulatory Visit: Payer: Self-pay | Admitting: Obstetrics and Gynecology

## 2020-05-27 ENCOUNTER — Encounter: Payer: Self-pay | Admitting: Obstetrics and Gynecology

## 2020-05-27 VITALS — BP 122/88 | Ht 65.0 in | Wt 237.0 lb

## 2020-05-27 DIAGNOSIS — E669 Obesity, unspecified: Secondary | ICD-10-CM

## 2020-05-27 DIAGNOSIS — Z6839 Body mass index (BMI) 39.0-39.9, adult: Secondary | ICD-10-CM | POA: Diagnosis not present

## 2020-05-27 DIAGNOSIS — R87612 Low grade squamous intraepithelial lesion on cytologic smear of cervix (LGSIL): Secondary | ICD-10-CM | POA: Diagnosis not present

## 2020-05-27 MED ORDER — PHENTERMINE HCL 37.5 MG PO TABS: 37.5000 mg | ORAL_TABLET | Freq: Every day | ORAL | 0 refills | Status: DC

## 2020-05-27 NOTE — Progress Notes (Signed)
Gynecology Office Visit  Chief Complaint:  Chief Complaint  Patient presents with   Colposcopy    History of Present Illness: Patientis a 39 y.o. G57P2001 female, who presents for the evaluation of the desire to lose weight. She has lost 8 pounds 1 months. The patient states the following symptoms since starting her weight loss therapy: appetite suppression, energy, and weight loss.  The patient also reports no other ill effects. The patient specifically denies heart palpitations, anxiety, and insomnia.    Review of Systems: 10 point review of systems negative unless otherwise noted in HPI  Past Medical History:  Past Medical History:  Diagnosis Date   Anxiety    Bulging lumbar disc     Past Surgical History:  Past Surgical History:  Procedure Laterality Date   CESAREAN SECTION N/A 05/30/2015   Procedure: CESAREAN SECTION ;  Surgeon: [name], MD;  Location: [facility] ORS;  Service: Obstetrics;  Laterality: N/A;    Gynecologic History: No LMP recorded. (Menstrual status: Oral contraceptives).  Obstetric History: G3P2001  Family History:  History reviewed. No pertinent family history.  Social History:  Social History   Socioeconomic History   Marital status: Married    Spouse name: Not on file   Number of children: Not on file   Years of education: Not on file   Highest education level: Not on file  Occupational History   Not on file  Tobacco Use   Smoking status: Never Smoker   Smokeless tobacco: Never Used  Substance and Sexual Activity   Alcohol use: No   Drug use: No   Sexual activity: Yes    Birth control/protection: Pill  Other Topics Concern   Not on file  Social History Narrative   Not on file   Social Determinants of Health   Financial Resource Strain:    Difficulty of Paying Living Expenses:   Food Insecurity:    Worried About Programme researcher, broadcasting/film/video in the Last Year:    Barista in the Last Year:   Transportation  Needs:    Freight forwarder (Medical):    Lack of Transportation (Non-Medical):   Physical Activity:    Days of Exercise per Week:    Minutes of Exercise per Session:   Stress:    Feeling of Stress :   Social Connections:    Frequency of Communication with Friends and Family:    Frequency of Social Gatherings with Friends and Family:    Attends Religious Services:    Active Member of Clubs or Organizations:    Attends Banker Meetings:    Marital Status:   Intimate Partner Violence:    Fear of Current or Ex-Partner:    Emotionally Abused:    Physically Abused:    Sexually Abused:     Allergies:  Allergies  Allergen Reactions   Penicillins Anaphylaxis   Percocet [Oxycodone-Acetaminophen] Other (See Comments)    migraine    Medications: Prior to Admission medications   Medication Sig Start Date End Date Taking? Authorizing Provider  clobetasol ointment (TEMOVATE) 0.05 %  12/25/18  Yes [provider]  Drospirenone (SLYND) 4 MG TABS Take 1 tablet by mouth daily. 04/17/20  Yes [name], MD  gabapentin (NEURONTIN) 300 MG capsule Take 300 mg by mouth at bedtime. 02/29/20  Yes [provider]  phentermine (ADIPEX-P) 37.5 MG tablet Take 1 tablet (37.5 mg total) by mouth daily before breakfast. 04/17/20  Yes [name], MD  Physical Exam Blood pressure 122/88, height 5\' 5"  (1.651 m), weight 237 lb (107.5 kg). Wt Readings from Last 3 Encounters:  05/27/20 237 lb (107.5 kg)  04/17/20 245 lb (111.1 kg)  03/07/19 210 lb (95.3 kg)  Body mass index is 39.44 kg/m.   General: NAD HEENT: normocephalic, anicteric Thyroid: no enlargement Pulmonary: no increased work of breathing Neurologic: Grossly intact Psychiatric: mood appropriate, affect full   GYNECOLOGY CLINIC COLPOSCOPY PROCEDURE NOTE  39 y.o. 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 Discussed underlying role for HPV infection in the development of cervical dysplasia, its natural history and progression/regression, need for surveillance.  Is the patient  pregnant: No LMP: No LMP recorded. (Menstrual status: Oral contraceptives). Smoking status:  reports that she has never smoked. She has never used smokeless tobacco.  Patient given informed consent, signed copy in the chart, time out was performed.  The patient was position in dorsal lithotomy position. Speculum was placed the cervix was visualized.   After application of acetic acid colposcopic inspection of the cervix was undertaken.   Colposcopy adequate, full visualization of transformation zone: Yes no visible lesions; corresponding biopsies obtained (random 12 O'Clock)  Tenaculum was used to fully visualized cervix.   ECC specimen obtained:  Yes  All specimens were labeled and sent to pathology.   Patient was given post procedure instructions.  Will follow up pathology and manage accordingly.  Routine preventative health maintenance measures emphasized.  OBGyn Exam  12/13/2018, MD, [name] [facility], [facility]    Assessment: 39 y.o. G3P2001 LGSIL pap HPV negative and medical weight loss follow up  Plan: Problem List Items Addressed This Visit    None    Visit Diagnoses    LGSIL on Pap smear of cervix    -  Primary   Relevant Orders   Surgical pathology   Class 2 obesity without serious comorbidity with body mass index (BMI) of 39.0 to 39.9 in adult, unspecified obesity type       Relevant Medications   phentermine (ADIPEX-P) 37.5 MG tablet      1) 1500 Calorie ADA Diet  2) Patient education given regarding appropriate lifestyle changes for weight loss including: regular physical activity, healthy coping strategies, caloric restriction and healthy eating patterns.  3) Patient will be started on weight loss medication. The risks and benefits and side effects  of medication, such as Adipex (Phenteramine) ,  Tenuate (Diethylproprion), Belviq (lorcarsin), Contrave (buproprion/naltrexone), Qsymia (phentermine/topiramate), and Saxenda (liraglutide) is discussed. The pros and cons of suppressing appetite and boosting metabolism is discussed. Risks of tolerence and addiction is discussed for selected agents discussed. Use of medicine will ne short term, such as 3-4 months at a time followed by a period of time off of the medicine to avoid these risks and side effects for Adipex, Qsymia, and Tenuate discussed. Pt to call with any negative side effects and agrees to keep follow up appts.  4) Patient to take medication, with the benefits of appetite suppression and metabolism boost d/w pt, along with the side effects and risk factors of long term use that will be avoided with our use of short bursts of therapy. Rx provided.    5) 15 minutes face-to-face; with counseling/coordination of care > 50 percent of visit related to obesity and ongoing management/treatment   6)    20, MD, [name] OB/[facility], [facility]  05/27/2020, 10:59 AM

## 2020-05-27 NOTE — Addendum Note (Signed)
Addended by: Lorrene Reid on: 05/27/2020 01:48 PM   Modules accepted: Orders

## 2020-05-27 NOTE — Progress Notes (Unsigned)
May 28th - 244lbs June 4th - 237lbs June 11th - 239lbs June 17th - 236.6 lbs

## 2020-05-27 NOTE — Telephone Encounter (Signed)
4 week medication follow up

## 2020-05-27 NOTE — Telephone Encounter (Signed)
Patient is scheduled 06/06/20 at 8:40

## 2020-05-30 LAB — ANATOMIC PATHOLOGY REPORT

## 2020-06-12 ENCOUNTER — Other Ambulatory Visit: Payer: Self-pay | Admitting: Obstetrics and Gynecology

## 2020-06-12 MED ORDER — SERTRALINE HCL 50 MG PO TABS
50.0000 mg | ORAL_TABLET | Freq: Every day | ORAL | 2 refills | Status: DC
Start: 2020-06-12 — End: 2020-08-14

## 2020-06-27 ENCOUNTER — Telehealth (INDEPENDENT_AMBULATORY_CARE_PROVIDER_SITE_OTHER): Payer: 59 | Admitting: Obstetrics and Gynecology

## 2020-06-27 ENCOUNTER — Other Ambulatory Visit: Payer: Self-pay

## 2020-06-27 ENCOUNTER — Encounter: Payer: Self-pay | Admitting: Obstetrics and Gynecology

## 2020-06-27 VITALS — Wt 231.0 lb

## 2020-06-27 DIAGNOSIS — F329 Major depressive disorder, single episode, unspecified: Secondary | ICD-10-CM

## 2020-06-27 DIAGNOSIS — E669 Obesity, unspecified: Secondary | ICD-10-CM

## 2020-06-27 DIAGNOSIS — F419 Anxiety disorder, unspecified: Secondary | ICD-10-CM

## 2020-06-27 DIAGNOSIS — Z6838 Body mass index (BMI) 38.0-38.9, adult: Secondary | ICD-10-CM | POA: Diagnosis not present

## 2020-06-27 MED ORDER — PHENTERMINE HCL 37.5 MG PO TABS: 37.5000 mg | ORAL_TABLET | Freq: Every day | ORAL | 0 refills | Status: DC

## 2020-06-27 MED ORDER — HYDROXYZINE HCL 25 MG PO TABS
25.0000 mg | ORAL_TABLET | Freq: Four times a day (QID) | ORAL | 2 refills | Status: DC | PRN
Start: 2020-06-27 — End: 2020-11-27

## 2020-06-27 NOTE — Progress Notes (Signed)
I connected with Susan Stevenson on 06/27/20 at 11:30 AM EDT by telephone and verified that I am speaking with the correct person using two identifiers.   I discussed the limitations, risks, security and privacy concerns of performing an evaluation and management service by telephone and the availability of in person appointments. I also discussed with the patient that there may be a patient responsible charge related to this service. The patient expressed understanding and agreed to proceed.  The patient was at home. I spoke with the patient from my workstation phone The names of people involved in this encounter were: Blakeleigh Barrie Folk , and [name]   Obstetrics & Gynecology Office Visit   Chief Complaint: No chief complaint on file.   History of Present Illness: The patient is a 39 y.o. female presenting follow up for symptoms of anxiety and depression.  The patient is currently taking Zoloft 50mg  for the management of her symptoms.  She has not had any recent situational stressors.  She reports symptoms of irritability and social anxiety.  She denies anhedonia, day time somnolence, insomnia, risk taking behavior, increased appetite, decreased appetite, agorophobia, feelings of guilt, feelings of worthlessness, suicidal ideation, homicidal ideation, auditory hallucinations and visual hallucinations. Symptoms have improved since last visit.      Patientis a 39 y.o. G98P2001 female, who presents for the evaluation of the desire to lose weight. She has lost 6 pounds in 1 months. The patient states the following symptoms since starting her weight loss therapy: appetite suppression, energy, and weight loss.  The patient also reports no other ill effects. The patient specifically denies heart palpitations, anxiety, and insomnia.    Review of Systems: review of systems negative unless otherwise noted in HPI  Past Medical History:  Past Medical History:  Diagnosis Date  .  Anxiety   . Bulging lumbar disc     Past Surgical History:  Past Surgical History:  Procedure Laterality Date  . CESAREAN SECTION N/A 05/30/2015   Procedure: CESAREAN SECTION ;  Surgeon: 07/31/2015, MD;  Location: [facility] ORS;  Service: Obstetrics;  Laterality: N/A;    Gynecologic History: Patient's last menstrual period was 06/26/2020.  Obstetric History: G3P2001  Family History:  No family history on file.  Social History:  Social History   Socioeconomic History  . Marital status: Married    Spouse name: Not on file  . Number of children: Not on file  . Years of education: Not on file  . Highest education level: Not on file  Occupational History  . Not on file  Tobacco Use  . Smoking status: Never Smoker  . Smokeless tobacco: Never Used  Substance and Sexual Activity  . Alcohol use: No  . Drug use: No  . Sexual activity: Yes    Birth control/protection: Pill  Other Topics Concern  . Not on file  Social History Narrative  . Not on file   Social Determinants of Health   Financial Resource Strain:   . Difficulty of Paying Living Expenses:   Food Insecurity:   . Worried About 06/28/2020 in the Last Year:   . Programme researcher, broadcasting/film/video in the Last Year:   Transportation Needs:   . Barista (Medical):   Freight forwarder Lack of Transportation (Non-Medical):   Physical Activity:   . Days of Exercise per Week:   . Minutes of Exercise per Session:   Stress:   . Feeling of Stress :   Social Connections:   .  Frequency of Communication with Friends and Family:   . Frequency of Social Gatherings with Friends and Family:   . Attends Religious Services:   . Active Member of Clubs or Organizations:   . Attends Banker Meetings:   Marland Kitchen Marital Status:   Intimate Partner Violence:   . Fear of Current or Ex-Partner:   . Emotionally Abused:   Marland Kitchen Physically Abused:   . Sexually Abused:     Allergies:  Allergies  Allergen Reactions  . Penicillins  Anaphylaxis  . Percocet [Oxycodone-Acetaminophen] Other (See Comments)    migraine    Medications: Prior to Admission medications   Medication Sig Start Date End Date Taking? Authorizing Provider  clobetasol ointment (TEMOVATE) 0.05 %  12/25/18  Yes [provider]  Drospirenone (SLYND) 4 MG TABS Take 1 tablet by mouth daily. 04/17/20  Yes [name], MD  gabapentin (NEURONTIN) 300 MG capsule Take 300 mg by mouth at bedtime. 02/29/20  Yes [provider]  phentermine (ADIPEX-P) 37.5 MG tablet Take 1 tablet (37.5 mg total) by mouth daily before breakfast. 05/27/20  Yes [name], MD  sertraline (ZOLOFT) 50 MG tablet Take 1 tablet (50 mg total) by mouth daily. 06/12/20  Yes [name], MD    Physical Exam Vitals: There were no vitals filed for this visit. Patient's last menstrual period was 06/26/2020. Body mass index is 38.44 kg/m.  No physical exam as this was a remote telephone visit to promote social distancing during the current COVID-19 Pandemic  GAD 7 : Generalized Anxiety Score 06/27/2020  Nervous, Anxious, on Edge 2  Control/stop worrying 2  Worry too much - different things 1  Trouble relaxing 1  Restless 1  Easily annoyed or irritable 2  Afraid - awful might happen 1  Total GAD 7 Score 10  Anxiety Difficulty Not difficult at all    Depression screen [facility] 2/9 06/27/2020  Decreased Interest 1  Down, Depressed, Hopeless 0  PHQ - 2 Score 1  Altered sleeping 0  Tired, decreased energy 0  Change in appetite 0  Feeling bad or failure about yourself  0  Trouble concentrating 0  Moving slowly or fidgety/restless 0  Suicidal thoughts 0  PHQ-9 Score 1  Difficult doing work/chores Not difficult at all    Depression screen [facility] 2/9 06/27/2020  Decreased Interest 1  Down, Depressed, Hopeless 0  PHQ - 2 Score 1  Altered sleeping 0  Tired, decreased energy 0  Change in appetite 0  Feeling bad or failure about yourself  0  Trouble  concentrating 0  Moving slowly or fidgety/restless 0  Suicidal thoughts 0  PHQ-9 Score 1  Difficult doing work/chores Not difficult at all     Assessment: 39 y.o. G3P2001 follow up anxiety/depression, medical weight loss Plan: Problem List Items Addressed This Visit    None    Visit Diagnoses    Anxiety and depression    -  Primary   Relevant Medications   hydrOXYzine (ATARAX/VISTARIL) 25 MG tablet   Class 2 obesity without serious comorbidity with body mass index (BMI) of 38.0 to 38.9 in adult, unspecified obesity type       Relevant Medications   phentermine (ADIPEX-P) 37.5 MG tablet      1) Continue zoloft 50mg , add vistaril prn  2) Thyroid and B12 screen has been obtained previously  3) Continue phentermine   4) Telephone time 7:18min  5) Return in about 4 weeks (around 07/25/2020) for Medication follow .  [name], MD, [facility], [facility] 06/27/2020, 12:15 PM

## 2020-07-04 IMAGING — MR MR LUMBAR SPINE W/O CM
4 of 5 series · 13 of 48 positions shown · non-contrast
Comparison: None.

CLINICAL DATA: Lumbar radiculitis with left leg pain

EXAM:
MRI LUMBAR SPINE WITHOUT CONTRAST
TECHNIQUE: Multiplanar, multisequence MR imaging of the lumbar spine was
performed. No intravenous contrast was administered.

[Series 8: T1 · axial · 4.0mm · 0.28mm/px · z∈[+73,+148]mm · 3 of 19 slices shown]
[im 4/19]
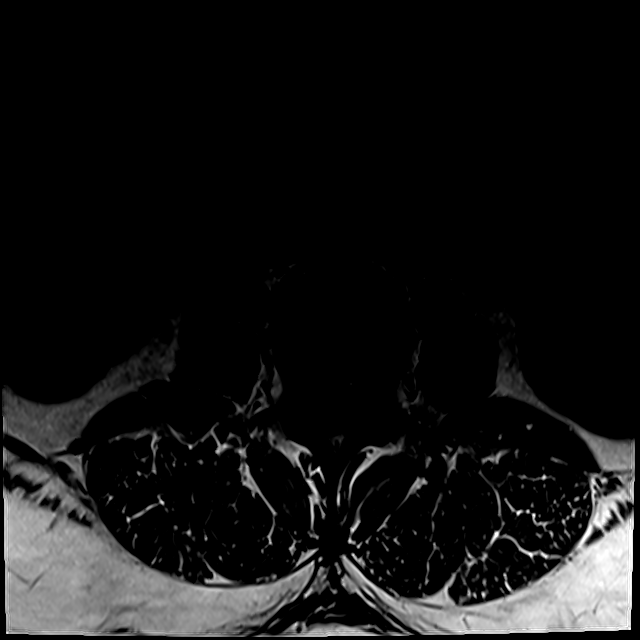
[im 11/19]
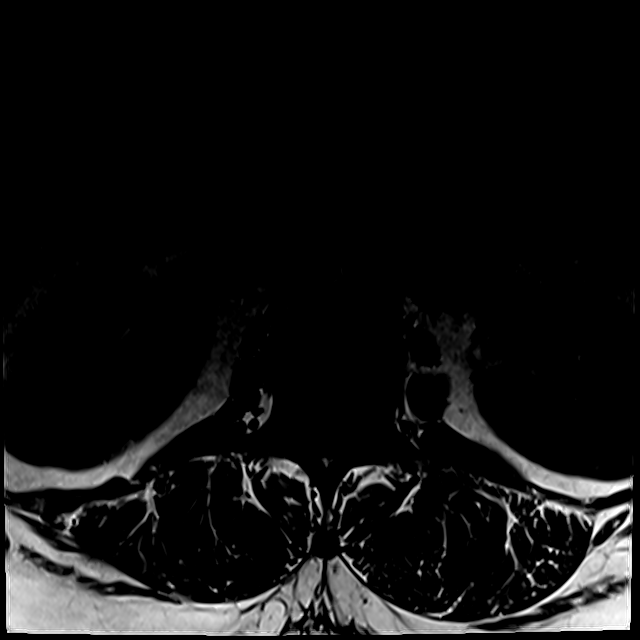
[im 19/19]
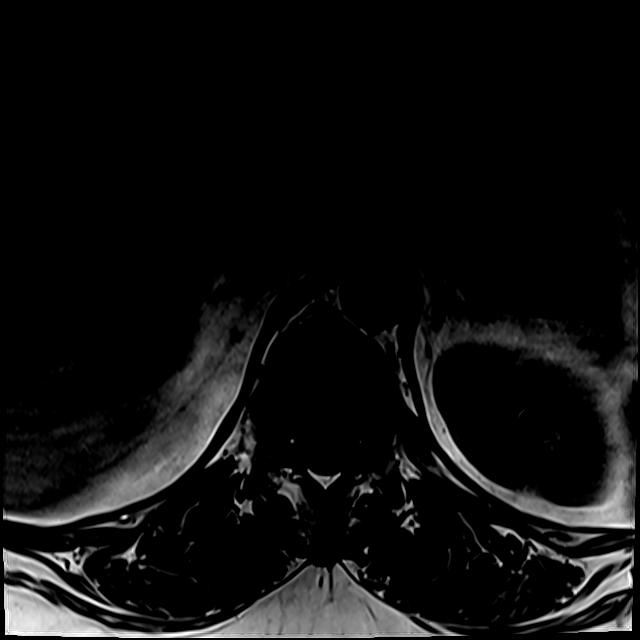

[Series 11: T2 · axial · 4.0mm · 0.28mm/px · z∈[+58,+133]mm · 4 of 19 slices shown (1 of 3)]
[im 1/19]
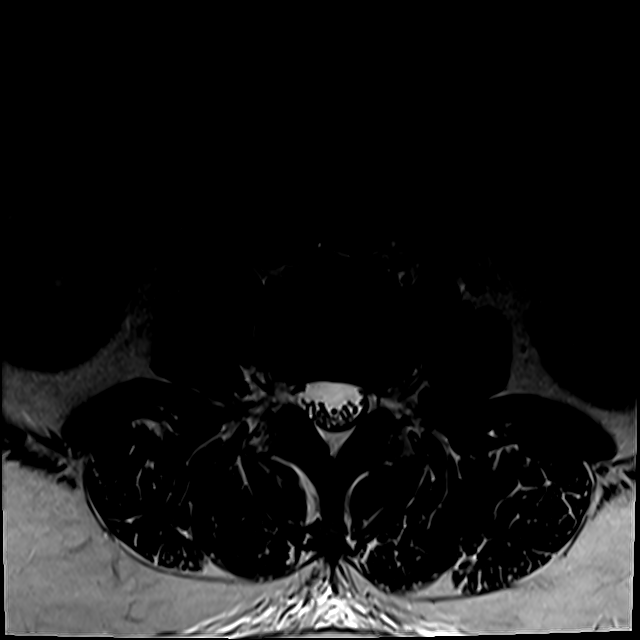
[im 4/19]
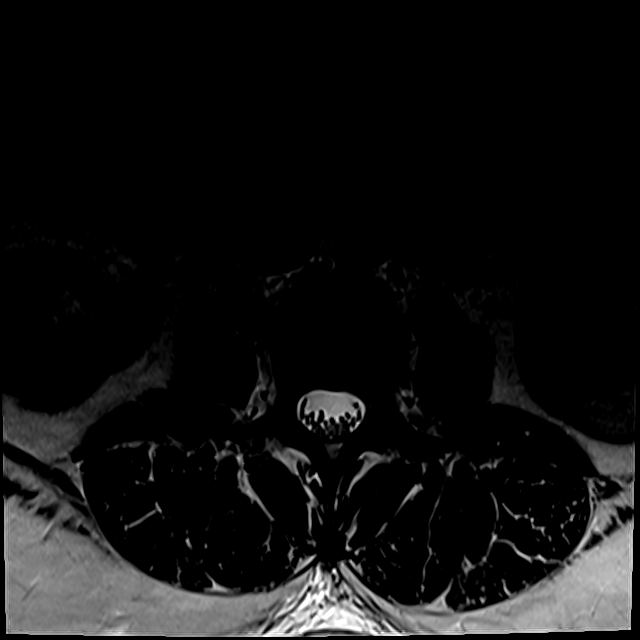
[im 10/19]
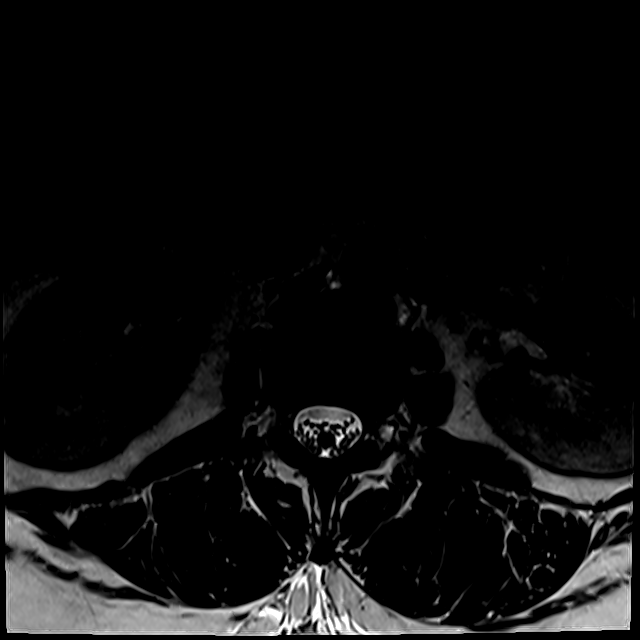
[im 16/19]
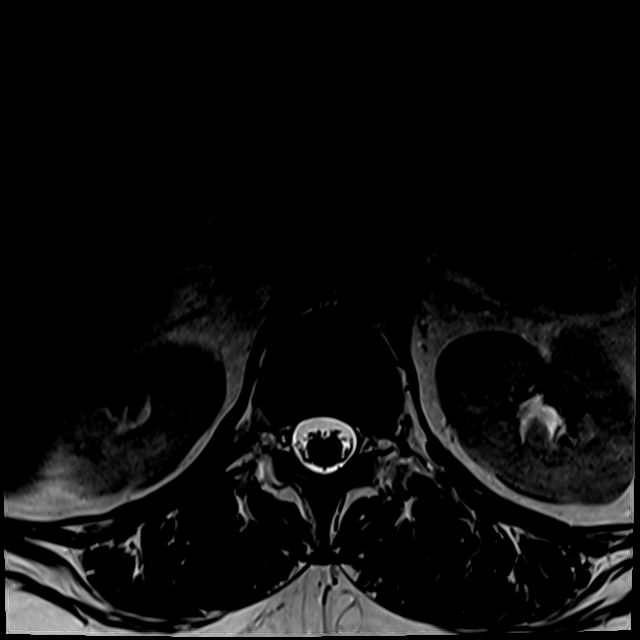

[Series 12: T2 · axial · 4.0mm · 0.28mm/px · z∈[-36,+24]mm · 3 of 20 slices shown (2 of 3)]
[im 4/20]
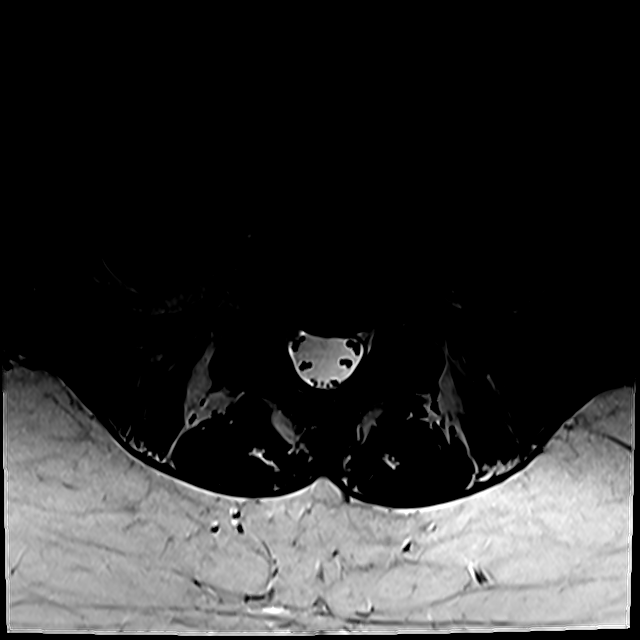
[im 10/20]
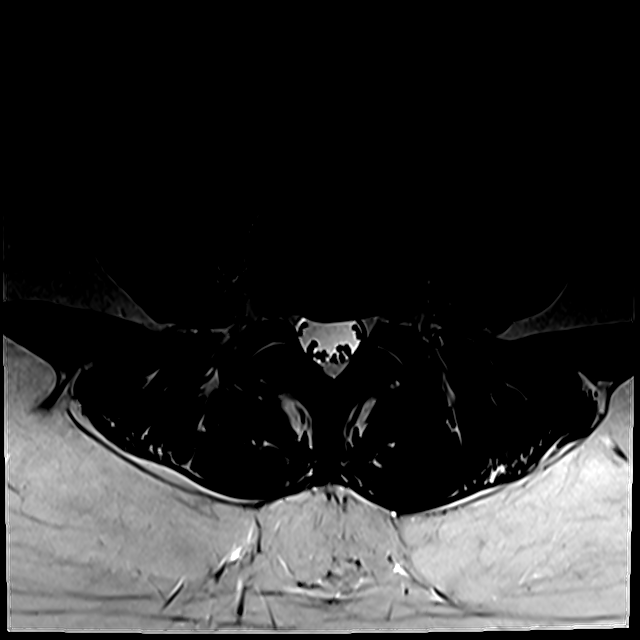
[im 16/20]
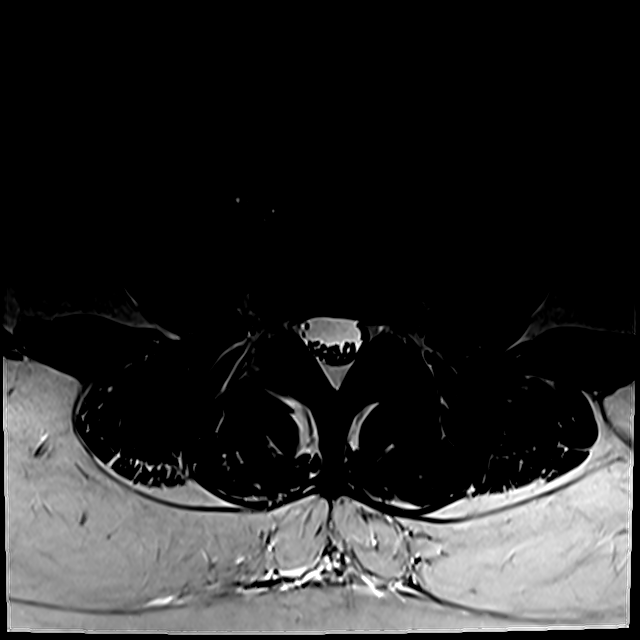

[Series 13: T2 · axial · 4.0mm · 0.28mm/px · z∈[-26,+118]mm · 3 of 39 slices shown (3 of 3)]
[im 6/39]
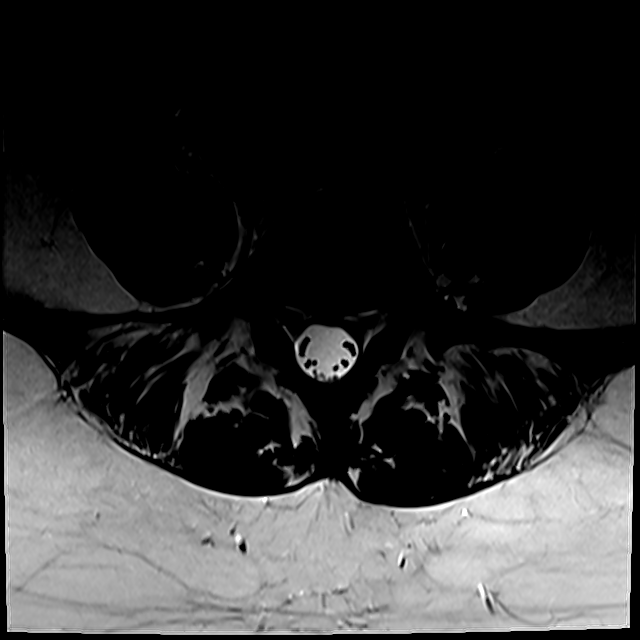
[im 21/39]
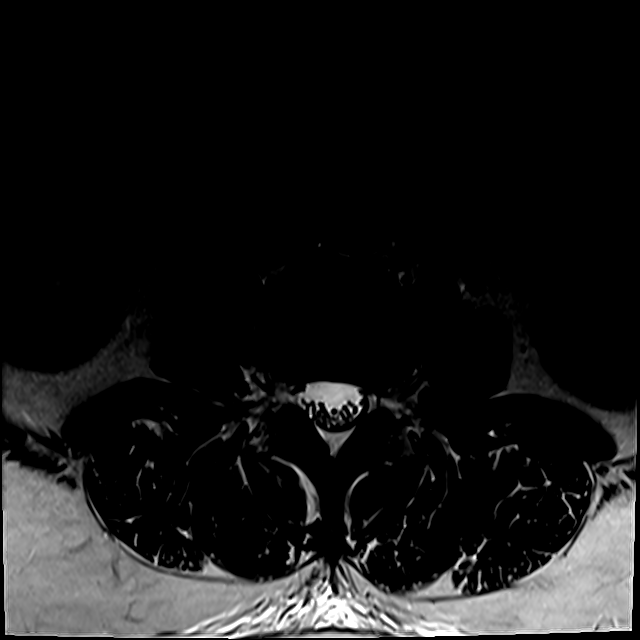
[im 33/39]
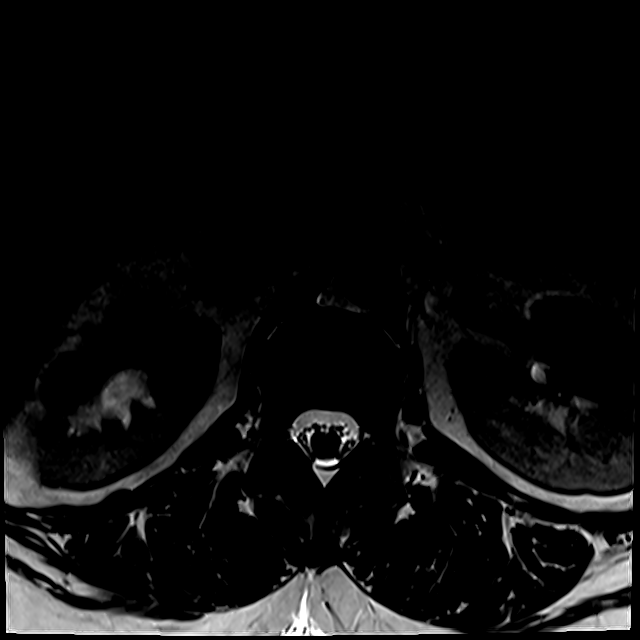

[13 of 48 positions shown; findings below may reference images not displayed]

FINDINGS: Segmentation:  Normal

Alignment:  Normal

Vertebrae:  Normal bone marrow.  Negative for fracture or mass.

Conus medullaris and cauda equina: Conus extends to the L1-2 level.
Conus and cauda equina appear normal.

Paraspinal and other soft tissues: Negative for paraspinous mass or
adenopathy or fluid collection.

Disc levels:

L1-2: Negative

L2-3: Negative

L3-4: Negative

L4-5: Mild disc desiccation and disc space narrowing. Small central
disc protrusion without stenosis.

L5-S1: Mild disc degeneration with small central disc protrusion.
Negative for stenosis or neural impingement.
IMPRESSION: Small central disc protrusions at L4-5 and L5-S1 without spinal or
foraminal stenosis. Negative for neural impingement.

## 2020-07-05 ENCOUNTER — Other Ambulatory Visit: Payer: Self-pay | Admitting: Obstetrics and Gynecology

## 2020-07-07 NOTE — Addendum Note (Signed)
Addended by: Lorrene Reid on: 07/07/2020 08:14 PM   Modules accepted: Level of Service

## 2020-07-28 ENCOUNTER — Telehealth (INDEPENDENT_AMBULATORY_CARE_PROVIDER_SITE_OTHER): Payer: 59 | Admitting: Obstetrics and Gynecology

## 2020-07-28 VITALS — Wt 231.0 lb

## 2020-07-28 DIAGNOSIS — Z6838 Body mass index (BMI) 38.0-38.9, adult: Secondary | ICD-10-CM

## 2020-07-28 DIAGNOSIS — E669 Obesity, unspecified: Secondary | ICD-10-CM

## 2020-07-28 MED ORDER — PHENTERMINE HCL 37.5 MG PO TABS: 37.5000 mg | ORAL_TABLET | Freq: Every day | ORAL | 0 refills | Status: AC

## 2020-07-28 NOTE — Progress Notes (Signed)
I connected with Susan Stevenson eon 07/28/20 at 11:10 AM EDT by telephone and verified that I am speaking with the correct person using two identifiers.   I discussed the limitations, risks, security and privacy concerns of performing an evaluation and management service by telephone and the availability of in person appointments. I also discussed with the patient that there may be a patient responsible charge related to this service. The patient expressed understanding and agreed to proceed.  The patient was at home I spoke with the patient from my workstation phone The names of people involved in this encounter were: Susan Stevenson , and Susan Stevenson   Gynecology Office Visit  Chief Complaint: No chief complaint on file.   History of Present Illness: Patientis a 39 y.o. G63P2001 female, who presents for the evaluation of the desire to lose weight. She has lost 0 pounds 0 months. The patient states the following symptoms since starting her weight loss therapy: appetite suppression, energy, and weight loss.  The patient also reports no other ill effects. The patient specifically denies heart palpitations, anxiety, and insomnia.    Review of Systems: 10 point review of systems negative unless otherwise noted in HPI  Past Medical History:  Past Medical History:  Diagnosis Date  . Anxiety   . Bulging lumbar disc     Past Surgical History:  Past Surgical History:  Procedure Laterality Date  . CESAREAN SECTION N/A 05/30/2015   Procedure: CESAREAN SECTION ;  Surgeon: Susan Austria, MD;  Location: [facility] ORS;  Service: Obstetrics;  Laterality: N/A;    Gynecologic History: No LMP recorded. (Menstrual status: Oral contraceptives).  Obstetric History: G3P2001  Family History:  No family history on file.  Social History:  Social History   Socioeconomic History  . Marital status: Married    Spouse name: Not on file  . Number of children: Not on file  . Years of  education: Not on file  . Highest education level: Not on file  Occupational History  . Not on file  Tobacco Use  . Smoking status: Never Smoker  . Smokeless tobacco: Never Used  Substance and Sexual Activity  . Alcohol use: No  . Drug use: No  . Sexual activity: Yes    Birth control/protection: Pill  Other Topics Concern  . Not on file  Social History Narrative  . Not on file   Social Determinants of Health   Financial Resource Strain:   . Difficulty of Paying Living Expenses: Not on file  Food Insecurity:   . Worried About Programme researcher, broadcasting/film/video in the Last Year: Not on file  . Ran Out of Food in the Last Year: Not on file  Transportation Needs:   . Lack of Transportation (Medical): Not on file  . Lack of Transportation (Non-Medical): Not on file  Physical Activity:   . Days of Exercise per Week: Not on file  . Minutes of Exercise per Session: Not on file  Stress:   . Feeling of Stress : Not on file  Social Connections:   . Frequency of Communication with Friends and Family: Not on file  . Frequency of Social Gatherings with Friends and Family: Not on file  . Attends Religious Services: Not on file  . Active Member of Clubs or Organizations: Not on file  . Attends Banker Meetings: Not on file  . Marital Status: Not on file  Intimate Partner Violence:   . Fear of Current or Ex-Partner:  Not on file  . Emotionally Abused: Not on file  . Physically Abused: Not on file  . Sexually Abused: Not on file    Allergies:  Allergies  Allergen Reactions  . Penicillins Anaphylaxis  . Percocet [Oxycodone-Acetaminophen] Other (See Comments)    migraine    Medications: Prior to Admission medications   Medication Sig Start Date End Date Taking? Authorizing Provider  clobetasol ointment (TEMOVATE) 0.05 %  12/25/18   [provider]  gabapentin (NEURONTIN) 300 MG capsule Take 300 mg by mouth at bedtime. 02/29/20   [provider]  hydrOXYzine  (ATARAX/VISTARIL) 25 MG tablet Take 1 tablet (25 mg total) by mouth every 6 (six) hours as needed for itching. 06/27/20   Susan Austria, MD  phentermine (ADIPEX-P) 37.5 MG tablet Take 1 tablet (37.5 mg total) by mouth daily before breakfast. 06/27/20   Susan Austria, MD  sertraline (ZOLOFT) 50 MG tablet Take 1 tablet (50 mg total) by mouth daily. 06/12/20   Susan Austria, MD  SLYND 4 MG TABS TAKE 1 TABLET BY MOUTH TO POST ANESTHESIA CARE UNIT FOR 1 DOSE 07/07/20   Susan Austria, MD    Physical Exam There were no vitals taken for this visit. Wt Readings from Last 3 Encounters:  07/28/20 231 lb (104.8 kg)  06/27/20 (!) 231 lb (104.8 kg)  05/27/20 237 lb (107.5 kg)  Body mass index is 38.44 kg/m.  No physical exam as this was a remote telephone visit to promote social distancing during the current COVID-19 Pandemic   Assessment: 39 y.o. G3P2001 No problem-specific Assessment & Plan notes found for this encounter.   Plan: Problem List Items Addressed This Visit    None    Visit Diagnoses    Class 2 obesity without serious comorbidity with body mass index (BMI) of 38.0 to 38.9 in adult, unspecified obesity type    -  Primary   Relevant Medications   phentermine (ADIPEX-P) 37.5 MG tablet      1) 1500 Calorie ADA Diet  2) Patient education given regarding appropriate lifestyle changes for weight loss including: regular physical activity, healthy coping strategies, caloric restriction and healthy eating patterns.  3) Patient to take medication, with the benefits of appetite suppression and metabolism boost d/w pt, along with the side effects and risk factors of long term use that will be avoided with our use of short bursts of therapy. Rx provided.    4) Video Visit 10:23min  5)  Return in about 4 weeks (around 08/25/2020) for medication follow up video or in person.   Susan Austria, MD, [facility], [facility] 07/28/2020, 11:15 AM

## 2020-08-06 ENCOUNTER — Telehealth: Payer: Self-pay

## 2020-08-06 NOTE — Telephone Encounter (Signed)
Pt calling; needs to send AMS a msg thru MyChart and attach a form to it but MyChart says there is no recipient.  Can she email it to [place]?  [phone]

## 2020-08-11 ENCOUNTER — Telehealth: Payer: Self-pay

## 2020-08-11 NOTE — Telephone Encounter (Signed)
[facility] faxed over a request for new birthcontrol stating plan does not cover Slynd. Please advise

## 2020-08-12 NOTE — Telephone Encounter (Signed)
Request from pharmacy for 90 day supply on Sertraline was received today. Please advise

## 2020-08-14 ENCOUNTER — Other Ambulatory Visit: Payer: Self-pay | Admitting: Obstetrics and Gynecology

## 2020-08-14 MED ORDER — SERTRALINE HCL 50 MG PO TABS: 50.0000 mg | ORAL_TABLET | Freq: Every day | ORAL | 3 refills | Status: DC

## 2020-10-29 ENCOUNTER — Other Ambulatory Visit: Payer: Self-pay | Admitting: Obstetrics and Gynecology

## 2020-11-23 ENCOUNTER — Other Ambulatory Visit: Payer: Self-pay | Admitting: Obstetrics and Gynecology

## 2020-11-24 NOTE — Telephone Encounter (Signed)
Please advise 

## 2020-12-08 NOTE — Telephone Encounter (Signed)
Called and spoke with patient. Patient is scheduled for Thursday, 12/11/20 at 2:30 with AMS

## 2020-12-08 NOTE — Telephone Encounter (Signed)
Need phone visit

## 2020-12-11 ENCOUNTER — Other Ambulatory Visit: Payer: Self-pay

## 2020-12-11 ENCOUNTER — Ambulatory Visit (INDEPENDENT_AMBULATORY_CARE_PROVIDER_SITE_OTHER): Payer: 59 | Admitting: Obstetrics and Gynecology

## 2020-12-11 VITALS — Wt 237.0 lb

## 2020-12-11 DIAGNOSIS — E669 Obesity, unspecified: Secondary | ICD-10-CM | POA: Diagnosis not present

## 2020-12-11 DIAGNOSIS — Z6839 Body mass index (BMI) 39.0-39.9, adult: Secondary | ICD-10-CM

## 2020-12-11 MED ORDER — DIETHYLPROPION HCL ER 75 MG PO TB24: 1.0000 | ORAL_TABLET | Freq: Every day | ORAL | 0 refills | Status: AC

## 2020-12-11 NOTE — Progress Notes (Signed)
I connected with Susan Stevenson on 12/11/20 at  2:30 PM EST by telephone and verified that I am speaking with the correct person using two identifiers.   I discussed the limitations, risks, security and privacy concerns of performing an evaluation and management service by telephone and the availability of in person appointments. I also discussed with the patient that there may be a patient responsible charge related to this service. The patient expressed understanding and agreed to proceed.  The patient was at home I spoke with the patient from my workstation phone. The names of people involved in this encounter were: Susan Stevenson , and [name]   Gynecology Office Visit  Chief Complaint: No chief complaint on file.   History of Present Illness: Patientis a 40 y.o. G60P2001 female, who presents for the evaluation of weight gain. She has gained 6lbs since her last phentermine course.  She is interested in trial of diethylproprion.  The patient states the following issues have contributed to her weight problem: inactivity, lifestyle The patient has no additional symptoms. The patient specifically denies memory loss, muscle weakness, excessive thirst, and polyuria. Weight related co-morbidities include none. . She has tried phentermine interventions in the past with modest success.   Review of Systems: 10 point review of systems negative unless otherwise noted in HPI  Past Medical History:  There are no problems to display for this patient.   Past Surgical History:  Past Surgical History:  Procedure Laterality Date  . CESAREAN SECTION N/A 05/30/2015   Procedure: CESAREAN SECTION ;  Surgeon: [name], MD;  Location: [facility] ORS;  Service: Obstetrics;  Laterality: N/A;    Gynecologic History: No LMP recorded. (Menstrual status: Oral contraceptives).  Obstetric History: G3P2001  Family History:  No family history on file.  Social History:  Social History    Socioeconomic History  . Marital status: Married    Spouse name: Not on file  . Number of children: Not on file  . Years of education: Not on file  . Highest education level: Not on file  Occupational History  . Not on file  Tobacco Use  . Smoking status: Never Smoker  . Smokeless tobacco: Never Used  Substance and Sexual Activity  . Alcohol use: No  . Drug use: No  . Sexual activity: Yes    Birth control/protection: Pill  Other Topics Concern  . Not on file  Social History Narrative  . Not on file   Social Determinants of Health   Financial Resource Strain: Not on file  Food Insecurity: Not on file  Transportation Needs: Not on file  Physical Activity: Not on file  Stress: Not on file  Social Connections: Not on file  Intimate Partner Violence: Not on file    Allergies:  Allergies  Allergen Reactions  . Penicillins Anaphylaxis  . Percocet [Oxycodone-Acetaminophen] Other (See Comments)    migraine    Medications: Prior to Admission medications   Medication Sig Start Date End Date Taking? Authorizing Provider  clobetasol ointment (TEMOVATE) 0.05 %  12/25/18   [provider]  gabapentin (NEURONTIN) 300 MG capsule Take 300 mg by mouth at bedtime. 02/29/20   [provider]  hydrOXYzine (ATARAX/VISTARIL) 25 MG tablet TAKE 1 TABLET(25 MG) BY MOUTH EVERY 6 HOURS AS NEEDED FOR ITCHING 11/27/20   [name], MD  phentermine (ADIPEX-P) 37.5 MG tablet Take 1 tablet (37.5 mg total) by mouth daily before breakfast. 07/28/20   [name], MD  sertraline (ZOLOFT) 50  MG tablet TAKE 1 TABLET(50 MG) BY MOUTH DAILY 11/27/20   [name], MD  SLYND 4 MG TABS TAKE 1 TABLET BY MOUTH TO POST ANESTHESIA CARE UNIT FOR 1 DOSE 07/07/20   [name], MD    Physical Exam Weight 237 lb (107.5 kg). Body mass index is 39.44 kg/m.  No physical exam as this was a remote telephone visit to promote social distancing during the current COVID-19  Pandemic  Assessment: 40 y.o. G3P2001 presenting for discussion of weight loss management options  Plan: Problem List Items Addressed This Visit   None   Visit Diagnoses    Class 2 obesity without serious comorbidity with body mass index (BMI) of 39.0 to 39.9 in adult, unspecified obesity type    -  Primary   Relevant Medications   Diethylpropion HCl CR 75 MG TB24      1) 1500 Calorie ADA Diet  2) Patient education given regarding appropriate lifestyle changes for weight loss including: regular physical activity, healthy coping strategies, caloric restriction and healthy eating patterns.  3) Patient will be started on weight loss medication. The risks and benefits and side effects of medication, such as Adipex (Phenteramine) ,  Tenuate (Diethylproprion), Contrave (buproprion/naltrexone), Qsymia (phentermine/topiramate), and Saxenda (liraglutide) is discussed. The pros and cons of suppressing appetite and boosting metabolism is discussed. Risks of tolerence and addiction is discussed for selected agents discussed. Use of medicine will ne short term, such as 3-4 months at a time followed by a period of time off of the medicine to avoid these risks and side effects for Adipex, Qsymia, and Tenuate discussed. Pt to call with any negative side effects and agrees to keep follow up appts. - Rx diethylproprion  4) Encouraged weekly weight monitorig to track progress and sample 1 week food diary  5) Telephone time 8:14min  6) Return in about 4 weeks (around 01/08/2021) for medication follow up phone.   [name], MD, [facility], [facility] 12/11/2020, 3:11 PM

## 2021-01-16 ENCOUNTER — Other Ambulatory Visit: Payer: Self-pay | Admitting: Obstetrics and Gynecology

## 2021-01-16 NOTE — Telephone Encounter (Signed)
Please advise 

## 2021-05-21 ENCOUNTER — Other Ambulatory Visit: Payer: Self-pay | Admitting: Obstetrics and Gynecology

## 2021-07-14 ENCOUNTER — Other Ambulatory Visit: Payer: Self-pay | Admitting: Obstetrics and Gynecology

## 2021-07-21 ENCOUNTER — Telehealth: Payer: 59

## 2021-07-21 NOTE — Telephone Encounter (Signed)
Pt calling; her ins is no longer covering her bc; has been on it for a while now; [facility] has faxed a PA at least twice; pt has four pills left in current pack; can the PA be done please?[phone]

## 2021-09-06 ENCOUNTER — Other Ambulatory Visit: Payer: Self-pay | Admitting: Obstetrics and Gynecology

## 2021-09-19 ENCOUNTER — Other Ambulatory Visit: Payer: Self-pay | Admitting: Obstetrics and Gynecology

## 2021-10-20 ENCOUNTER — Other Ambulatory Visit: Payer: Self-pay

## 2021-10-20 ENCOUNTER — Ambulatory Visit
Admission: EM | Admit: 2021-10-20 | Discharge: 2021-10-20 | Disposition: A | Discharge status: Home / Self Care | Payer: 59 | Attending: Physician Assistant | Admitting: Physician Assistant

## 2021-10-20 ENCOUNTER — Ambulatory Visit: Payer: Self-pay

## 2021-10-20 DIAGNOSIS — J069 Acute upper respiratory infection, unspecified: Secondary | ICD-10-CM | POA: Insufficient documentation

## 2021-10-20 DIAGNOSIS — Z20822 Contact with and (suspected) exposure to covid-19: Secondary | ICD-10-CM | POA: Diagnosis not present

## 2021-10-20 DIAGNOSIS — J029 Acute pharyngitis, unspecified: Secondary | ICD-10-CM | POA: Diagnosis not present

## 2021-10-20 DIAGNOSIS — R051 Acute cough: Secondary | ICD-10-CM | POA: Diagnosis not present

## 2021-10-20 DIAGNOSIS — R0981 Nasal congestion: Secondary | ICD-10-CM | POA: Insufficient documentation

## 2021-10-20 LAB — GROUP A STREP BY PCR: Group A Strep by PCR: NOT DETECTED

## 2021-10-20 LAB — RESP PANEL BY RT-PCR (FLU A&B, COVID) ARPGX2
Influenza A by PCR: NEGATIVE
Influenza B by PCR: NEGATIVE
SARS Coronavirus 2 by RT PCR: NEGATIVE

## 2021-10-20 MED ORDER — LIDOCAINE VISCOUS HCL 2 % MT SOLN: 15.0000 mL | OROMUCOSAL | 0 refills | Status: AC | PRN

## 2021-10-20 MED ORDER — PSEUDOEPH-BROMPHEN-DM 30-2-10 MG/5ML PO SYRP: 10.0000 mL | ORAL_SOLUTION | Freq: Four times a day (QID) | ORAL | 0 refills | Status: AC | PRN

## 2021-10-20 NOTE — ED Triage Notes (Signed)
Sx started on Sunday after going on a field trip on Thursday. Nasal congestion, sore throat, laryngitis, bodyaches and chills

## 2021-10-20 NOTE — ED Provider Notes (Signed)
[facility]    CSN: [id number] Arrival date & time: 10/20/21  4132      History   Chief Complaint Chief Complaint  Patient presents with   Generalized Body Aches   Nasal Congestion   Sore Throat    HPI Susan Stevenson is a 40 y.o. female presenting for 2-day history of sore throat, voice hoarseness, body aches, fatigue, chills, cough and congestion.  Denies any known fevers but did feel chilled at onset.  Denies any chest pain, wheezing or difficulty breathing.  No vomiting or diarrhea.  Patient reports going on a field trip with a bunch of children 3 days before onset of symptoms.  States multiple kids on the bus for coughing.  She is unsure if she has been exposed to COVID or influenza but would like to be tested for both as well as strep.  Personal history of COVID-19 2 months ago.  Patient has taken ibuprofen but no other medication for symptoms.  She is otherwise healthy.  No other complaints.  HPI  Past Medical History:  Diagnosis Date   Anxiety    Bulging lumbar disc     There are no problems to display for this patient.   Past Surgical History:  Procedure Laterality Date   CESAREAN SECTION N/A 05/30/2015   Procedure: CESAREAN SECTION ;  Surgeon: [name], MD;  Location: [facility] ORS;  Service: Obstetrics;  Laterality: N/A;    OB History     Gravida  3   Para  2   Term  2   Preterm      AB      Living  1      SAB      IAB      Ectopic      Multiple  0   Live Births  1            Home Medications    Prior to Admission medications   Medication Sig Start Date End Date Taking? Authorizing Provider  brompheniramine-pseudoephedrine-DM 30-2-10 MG/5ML syrup Take 10 mLs by mouth 4 (four) times daily as needed for up to 7 days. 10/20/21 10/27/21 Yes Eusebio Friendly B, PA-C  lidocaine (XYLOCAINE) 2 % solution Use as directed 15 mLs in the mouth or throat as needed for mouth pain (swish and spit). 10/20/21  Yes Eusebio Friendly B,  PA-C  clobetasol ointment (TEMOVATE) 0.05 %  12/25/18   [provider]  Diethylpropion HCl CR 75 MG TB24 Take 1 tablet (75 mg total) by mouth daily before breakfast. 12/11/20   [name], MD  gabapentin (NEURONTIN) 300 MG capsule Take 300 mg by mouth at bedtime. 02/29/20   [provider]  phentermine (ADIPEX-P) 37.5 MG tablet Take 1 tablet (37.5 mg total) by mouth daily before breakfast. 07/28/20   [name], MD  sertraline (ZOLOFT) 50 MG tablet TAKE 1 TABLET(50 MG) BY MOUTH DAILY 09/07/21   [name], MD  SLYND 4 MG TABS TAKE 1 TABLET BY MOUTH TO POST ANESTHESIA CARE UNIT FOR 1 DOSE 07/14/21   [name], MD    Family History History reviewed. No pertinent family history.  Social History Social History   Tobacco Use   Smoking status: Never   Smokeless tobacco: Never  Vaping Use   Vaping Use: Never used  Substance Use Topics   Alcohol use: Yes    Comment: weekends   Drug use: No     Allergies   Penicillins and Percocet [oxycodone-acetaminophen]  Review of Systems Review of Systems  Constitutional:  Positive for chills and fatigue. Negative for diaphoresis and fever.  HENT:  Positive for congestion, rhinorrhea, sore throat and voice change. Negative for ear pain, sinus pressure, sinus pain and trouble swallowing.   Respiratory:  Positive for cough. Negative for shortness of breath and wheezing.   Cardiovascular:  Negative for chest pain.  Gastrointestinal:  Negative for abdominal pain, nausea and vomiting.  Musculoskeletal:  Positive for myalgias. Negative for arthralgias.  Skin:  Negative for rash.  Neurological:  Positive for headaches. Negative for weakness.  Hematological:  Negative for adenopathy.    Physical Exam Triage Vital Signs ED Triage Vitals [10/20/21 0823]  Enc Vitals Group     BP      Pulse      Resp      Temp      Temp src      SpO2      Weight 250 lb (113.4 kg)     Height 5\' 5"  (1.651 m)     Head  Circumference      Peak Flow      Pain Score 6     Pain Loc      Pain Edu?      Excl. in GC?    No data found.  Updated Vital Signs BP 133/86   Pulse 82   Temp 99.4 F (37.4 C) (Oral)   Resp 18   Ht 5\' 5"  (1.651 m)   Wt 250 lb (113.4 kg)   SpO2 98%   BMI 41.60 kg/m      Physical Exam Vitals and nursing note reviewed.  Constitutional:      General: She is not in acute distress.    Appearance: Normal appearance. She is ill-appearing. She is not toxic-appearing.     Comments: +hoarse voice noted  HENT:     Head: Normocephalic and atraumatic.     Nose: Congestion present.     Mouth/Throat:     Mouth: Mucous membranes are moist.     Pharynx: Oropharynx is clear. Posterior oropharyngeal erythema present.     Tonsils: 1+ on the right. 1+ on the left.  Eyes:     General: No scleral icterus.       Right eye: No discharge.        Left eye: No discharge.     Conjunctiva/sclera: Conjunctivae normal.  Cardiovascular:     Rate and Rhythm: Normal rate and regular rhythm.     Heart sounds: Normal heart sounds.  Pulmonary:     Effort: Pulmonary effort is normal. No respiratory distress.     Breath sounds: Normal breath sounds.  Musculoskeletal:     Cervical back: Neck supple.  Skin:    General: Skin is dry.  Neurological:     General: No focal deficit present.     Mental Status: She is alert. Mental status is at baseline.     Motor: No weakness.     Gait: Gait normal.  Psychiatric:        Mood and Affect: Mood normal.        Behavior: Behavior normal.        Thought Content: Thought content normal.     UC Treatments / Results  Labs (all labs ordered are listed, but only abnormal results are displayed) Labs Reviewed  GROUP A STREP BY PCR  RESP PANEL BY RT-PCR (FLU A&B, COVID) [id number]    EKG   Radiology No results found.  Procedures Procedures (including critical care time)  Medications Ordered in UC Medications - No data to display  Initial Impression /  Assessment and Plan / UC Course  I have reviewed the triage vital signs and the nursing notes.  Pertinent labs & imaging results that were available during my care of the patient were reviewed by me and considered in my medical decision making (see chart for details).   40 year old female presenting for 2-day history of fatigue, body aches, chills, cough, sore throat and congestion.  Vitals are stable.  Patient is ill-appearing but nontoxic.  Exam significant for posterior pharyngeal erythema with 1+ bilateral tonsillar enlargement, bilateral anterior cervical lymphadenopathy, and nasal congestion.  Chest clear to auscultation.  PCR strep test obtained. Respiratory panel obtained.  I did discuss the patient that she could have a persistent positive from her previous COVID infection 2 months ago so I would be unsure if she has a new infection or if it is positive from 2 months ago.  Patient is aware and states she would like to be tested still since she hopes to go around family for Thanksgiving.  Discussed with her that even if she is negative for everything we test for, she likely has another viral illness which is still contagious so she should wear her mask and sanitize if she plans to go around others.  Discussed with patient the option to stay and wait for results versus having me call her with any positives.  Patient would like to go home and have me call if anything is positive.  Advised her that she could access her results on MyChart.  Advised patient symptoms consistent with viral URI.  Supportive care encouraged.  Sent Bromfed-DM to pharmacy as well as viscous lidocaine.  Tylenol Motrin for discomfort.  Reviewed return and ED precautions.  Negative strep, COVID, flu  Final Clinical Impressions(s) / UC Diagnoses   Final diagnoses:  Viral upper respiratory tract infection  Nasal congestion  Acute cough  Sore throat     Discharge Instructions      -I will call if your strep, COVID  or flu tests are positive.  If you do not hear from me those tests are all negative and you likely have another viral illness which should run its course in 1 to 2 weeks. - Supportive care encouraged with increasing rest and fluids.  I have sent a cough medication and viscous lidocaine to pharmacy.  May also take ibuprofen and Tylenol for discomfort. - If not feeling better after 10 days or symptoms acutely worsen such as severe cough, chest pain, breathing difficulty, sinus pain, ear pain, worsening throat pain or swelling he should be seen again. - As we discussed you are contagious to others even if your flu and COVID tests are negative.  If you decide to attend Thanksgiving dinners, I would advise you to wear a mask and sanitize.     ED Prescriptions     Medication Sig Dispense Auth. Provider   brompheniramine-pseudoephedrine-DM 30-2-10 MG/5ML syrup Take 10 mLs by mouth 4 (four) times daily as needed for up to 7 days. 150 mL Eusebio Friendly B, PA-C   lidocaine (XYLOCAINE) 2 % solution Use as directed 15 mLs in the mouth or throat as needed for mouth pain (swish and spit). 100 mL Shirlee Latch, PA-C      PDMP not reviewed this encounter.   Shirlee Latch, PA-C 10/20/21 [phone]

## 2021-10-20 NOTE — Discharge Instructions (Signed)
-  I will call if your strep, COVID or flu tests are positive.  If you do not hear from me those tests are all negative and you likely have another viral illness which should run its course in 1 to 2 weeks. - Supportive care encouraged with increasing rest and fluids.  I have sent a cough medication and viscous lidocaine to pharmacy.  May also take ibuprofen and Tylenol for discomfort. - If not feeling better after 10 days or symptoms acutely worsen such as severe cough, chest pain, breathing difficulty, sinus pain, ear pain, worsening throat pain or swelling he should be seen again. - As we discussed you are contagious to others even if your flu and COVID tests are negative.  If you decide to attend Thanksgiving dinners, I would advise you to wear a mask and sanitize.

## 2021-10-25 ENCOUNTER — Encounter: Payer: Self-pay | Admitting: Emergency Medicine

## 2021-10-25 ENCOUNTER — Other Ambulatory Visit: Payer: Self-pay

## 2021-10-25 ENCOUNTER — Ambulatory Visit
Admission: EM | Admit: 2021-10-25 | Discharge: 2021-10-25 | Disposition: A | Discharge status: Home / Self Care | Payer: 59 | Attending: Emergency Medicine | Admitting: Emergency Medicine

## 2021-10-25 DIAGNOSIS — H66002 Acute suppurative otitis media without spontaneous rupture of ear drum, left ear: Secondary | ICD-10-CM

## 2021-10-25 DIAGNOSIS — H6691 Otitis media, unspecified, right ear: Secondary | ICD-10-CM

## 2021-10-25 MED ORDER — ALBUTEROL SULFATE HFA 108 (90 BASE) MCG/ACT IN AERS: 2.0000 | INHALATION_SPRAY | RESPIRATORY_TRACT | 0 refills | Status: DC | PRN

## 2021-10-25 MED ORDER — ALBUTEROL SULFATE HFA 108 (90 BASE) MCG/ACT IN AERS: 2.0000 | INHALATION_SPRAY | RESPIRATORY_TRACT | 0 refills | Status: AC | PRN

## 2021-10-25 MED ORDER — DOXYCYCLINE HYCLATE 100 MG PO CAPS: 100.0000 mg | ORAL_CAPSULE | Freq: Two times a day (BID) | ORAL | 0 refills | Status: AC

## 2021-10-25 MED ORDER — BENZONATATE 100 MG PO CAPS: 100.0000 mg | ORAL_CAPSULE | Freq: Three times a day (TID) | ORAL | 0 refills | Status: AC

## 2021-10-25 NOTE — ED Triage Notes (Signed)
Patient c/o cough and congestion that started on Wed.  Patient states that she was seen on 10/20/21 for these symptoms. Patient states her cough has gotten worse. Patient denies fevers.

## 2021-10-25 NOTE — Discharge Instructions (Signed)
Rest, push fluids, take antibiotic as directed.  Follow-up with PCP if no improvement 3 days

## 2021-10-25 NOTE — ED Provider Notes (Signed)
[facility]    CSN: [id number] Arrival date & time: 10/25/21  1147      History   Chief Complaint No chief complaint on file.   HPI Susan Stevenson is a 40 y.o. female.   40 year old female patient, Susan Stevenson, presents to emergency room chief complaint of cough and congestion for over a week states symptoms are just getting worse was recently seen 11/22 for the same  The history is provided by the patient. No language interpreter was used.   Past Medical History:  Diagnosis Date   Anxiety    Bulging lumbar disc     Patient Active Problem List   Diagnosis Date Noted   Right acute otitis media 10/25/2021     Past Surgical History:  Procedure Laterality Date   CESAREAN SECTION N/A 05/30/2015   Procedure: CESAREAN SECTION ;  Surgeon: [name], MD;  Location: [facility] ORS;  Service: Obstetrics;  Laterality: N/A;    OB History     Gravida  3   Para  2   Term  2   Preterm      AB      Living  1      SAB      IAB      Ectopic      Multiple  0   Live Births  1            Home Medications    Prior to Admission medications   Medication Sig Start Date End Date Taking? Authorizing Provider  benzonatate (TESSALON) 100 MG capsule Take 1 capsule (100 mg total) by mouth every 8 (eight) hours. 10/25/21  Yes Emaly Boschert, Para March, NP  brompheniramine-pseudoephedrine-DM 30-2-10 MG/5ML syrup Take 10 mLs by mouth 4 (four) times daily as needed for up to 7 days. 10/20/21 10/27/21 Yes Eusebio Friendly B, PA-C  doxycycline (VIBRAMYCIN) 100 MG capsule Take 1 capsule (100 mg total) by mouth 2 (two) times daily for 7 days. 10/25/21 11/01/21 Yes Seabron Iannello, Para March, NP  albuterol (VENTOLIN HFA) 108 (90 Base) MCG/ACT inhaler Inhale 2 puffs into the lungs every 4 (four) hours as needed for wheezing or shortness of breath. 10/25/21   Luman Holway, Para March, NP  clobetasol ointment (TEMOVATE) 0.05 %  12/25/18   [provider]  Diethylpropion HCl CR  75 MG TB24 Take 1 tablet (75 mg total) by mouth daily before breakfast. 12/11/20   [name], MD  gabapentin (NEURONTIN) 300 MG capsule Take 300 mg by mouth at bedtime. 02/29/20   [provider]  lidocaine (XYLOCAINE) 2 % solution Use as directed 15 mLs in the mouth or throat as needed for mouth pain (swish and spit). 10/20/21   Shirlee Latch, PA-C  phentermine (ADIPEX-P) 37.5 MG tablet Take 1 tablet (37.5 mg total) by mouth daily before breakfast. 07/28/20   [name], MD  sertraline (ZOLOFT) 50 MG tablet TAKE 1 TABLET(50 MG) BY MOUTH DAILY 09/07/21   [name], MD  SLYND 4 MG TABS TAKE 1 TABLET BY MOUTH TO POST ANESTHESIA CARE UNIT FOR 1 DOSE 07/14/21   [name], MD    Family History History reviewed. No pertinent family history.  Social History Social History   Tobacco Use   Smoking status: Never   Smokeless tobacco: Never  Vaping Use   Vaping Use: Never used  Substance Use Topics   Alcohol use: Yes    Comment: weekends   Drug use: No     Allergies   Penicillins and Percocet [oxycodone-acetaminophen]  Review of Systems Review of Systems  HENT:  Positive for congestion, ear pain, sinus pressure, sinus pain and sore throat.   Respiratory:  Positive for cough.   All other systems reviewed and are negative.   Physical Exam Triage Vital Signs ED Triage Vitals  Enc Vitals Group     BP 10/25/21 1241 (!) 137/94     Pulse Rate 10/25/21 1241 (!) 117     Resp 10/25/21 1241 15     Temp 10/25/21 1241 98.8 F (37.1 C)     Temp Source 10/25/21 1241 Oral     SpO2 10/25/21 1241 98 %     Weight 10/25/21 1239 250 lb (113.4 kg)     Height 10/25/21 1239 5\' 5"  (1.651 m)     Head Circumference --      Peak Flow --      Pain Score 10/25/21 1239 5     Pain Loc --      Pain Edu? --      Excl. in GC? --    No data found.  Updated Vital Signs BP (!) 137/94 (BP Location: Left Arm)   Pulse (!) 117   Temp 98.8 F (37.1 C) (Oral)   Resp 15    Ht 5\' 5"  (1.651 m)   Wt 250 lb (113.4 kg)   SpO2 98%   BMI 41.60 kg/m   Visual Acuity Right Eye Distance:   Left Eye Distance:   Bilateral Distance:    Right Eye Near:   Left Eye Near:    Bilateral Near:     Physical Exam Vitals and nursing note reviewed.  Constitutional:      General: She is not in acute distress.    Appearance: She is well-developed.  HENT:     Head: Normocephalic.     Right Ear: Tympanic membrane is erythematous and bulging.     Left Ear: Tympanic membrane is retracted.     Nose: Congestion present.     Mouth/Throat:     Lips: Pink.     Mouth: Mucous membranes are moist.     Pharynx: Oropharynx is clear.  Eyes:     General: Lids are normal.     Conjunctiva/sclera: Conjunctivae normal.     Pupils: Pupils are equal, round, and reactive to light.  Neck:     Trachea: No tracheal deviation.  Cardiovascular:     Rate and Rhythm: Regular rhythm. Tachycardia present.     Pulses: Normal pulses.     Heart sounds: Normal heart sounds. No murmur heard.    Comments: Patient has been taking decongestants, noted to be tachycardic at today's visit Pulmonary:     Effort: Pulmonary effort is normal.     Breath sounds: Normal breath sounds and air entry.  Abdominal:     General: Bowel sounds are normal.     Palpations: Abdomen is soft.     Tenderness: There is no abdominal tenderness.  Musculoskeletal:        General: Normal range of motion.     Cervical back: Normal range of motion.  Lymphadenopathy:     Cervical: No cervical adenopathy.  Skin:    General: Skin is warm and dry.     Findings: No rash.  Neurological:     General: No focal deficit present.     Mental Status: She is alert and oriented to person, place, and time.     GCS: GCS eye subscore is 4. GCS verbal subscore is 5. GCS motor  subscore is 6.  Psychiatric:        Attention and Perception: Attention normal.        Mood and Affect: Mood normal.        Speech: Speech normal.         Behavior: Behavior normal. Behavior is cooperative.     UC Treatments / Results  Labs (all labs ordered are listed, but only abnormal results are displayed) Labs Reviewed - No data to display  EKG   Radiology No results found.  Procedures Procedures (including critical care time)  Medications Ordered in UC Medications - No data to display  Initial Impression / Assessment and Plan / UC Course  I have reviewed the triage vital signs and the nursing notes.  Pertinent labs & imaging results that were available during my care of the patient were reviewed by me and considered in my medical decision making (see chart for details).     Ddx: Acute right OM, Viral illness Final Clinical Impressions(s) / UC Diagnoses   Final diagnoses:  Right acute otitis media     Discharge Instructions      Rest, push fluids, take antibiotic as directed.  Follow-up with PCP if no improvement 3 days     ED Prescriptions     Medication Sig Dispense Auth. Provider   doxycycline (VIBRAMYCIN) 100 MG capsule Take 1 capsule (100 mg total) by mouth 2 (two) times daily for 7 days. 14 capsule Adriann Ballweg, NP   albuterol (VENTOLIN HFA) 108 (90 Base) MCG/ACT inhaler  (Status: Discontinued) Inhale 2 puffs into the lungs every 4 (four) hours as needed for wheezing or shortness of breath. 1 each Lakima Dona, NP   benzonatate (TESSALON) 100 MG capsule Take 1 capsule (100 mg total) by mouth every 8 (eight) hours. 21 capsule Nolyn Eilert, NP   albuterol (VENTOLIN HFA) 108 (90 Base) MCG/ACT inhaler Inhale 2 puffs into the lungs every 4 (four) hours as needed for wheezing or shortness of breath. 1 each Chase Arnall, Para March, NP      PDMP not reviewed this encounter.   Clancy Gourd, NP 10/25/21 1513

## 2021-11-30 ENCOUNTER — Encounter: Payer: Self-pay | Admitting: Obstetrics and Gynecology

## 2022-02-23 ENCOUNTER — Other Ambulatory Visit: Payer: Self-pay | Admitting: Internal Medicine

## 2022-02-23 DIAGNOSIS — Z1231 Encounter for screening mammogram for malignant neoplasm of breast: Secondary | ICD-10-CM
